# Patient Record
Sex: Female | Born: 1990 | Race: White | Hispanic: No | Marital: Single | Smoking: Never smoker
Health system: Southern US, Community
[De-identification: ages and names within clinical notes are randomized; demographics above are authoritative.]

## PROBLEM LIST (undated history)

## (undated) DIAGNOSIS — E119 Type 2 diabetes mellitus without complications: Secondary | ICD-10-CM

## (undated) DIAGNOSIS — E079 Disorder of thyroid, unspecified: Secondary | ICD-10-CM

## (undated) DIAGNOSIS — R569 Unspecified convulsions: Secondary | ICD-10-CM

## (undated) HISTORY — PX: ABDOMINAL SURGERY: SHX537

---
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
## 2010-07-20 LAB — GLUCOSE, CAPILLARY: Glucose-Capillary: 144 mg/dL — ABNORMAL HIGH (ref 70–99)

## 2010-07-20 LAB — DIFFERENTIAL
Lymphs Abs: 3.2 10*3/uL (ref 0.7–4.0)
Monocytes Relative: 8 % (ref 3–12)
Neutro Abs: 4.1 10*3/uL (ref 1.7–7.7)
Neutrophils Relative %: 51 % (ref 43–77)

## 2010-07-20 LAB — CBC
Hemoglobin: 13.2 g/dL (ref 12.0–15.0)
MCHC: 34.8 g/dL (ref 30.0–36.0)
Platelets: 201 10*3/uL (ref 150–400)
RDW: 13.4 % (ref 11.5–15.5)

## 2010-07-20 LAB — URINALYSIS, ROUTINE W REFLEX MICROSCOPIC
Glucose, UA: 100 mg/dL — AB
Ketones, ur: 15 mg/dL — AB
Protein, ur: NEGATIVE mg/dL
Urobilinogen, UA: 0.2 mg/dL (ref 0.0–1.0)

## 2010-07-20 LAB — COMPREHENSIVE METABOLIC PANEL
Albumin: 3.9 g/dL (ref 3.5–5.2)
BUN: 12 mg/dL (ref 6–23)
Calcium: 9.3 mg/dL (ref 8.4–10.5)
Glucose, Bld: 121 mg/dL — ABNORMAL HIGH (ref 70–99)
Sodium: 135 mEq/L (ref 135–145)
Total Protein: 7.1 g/dL (ref 6.0–8.3)

## 2010-09-15 NOTE — H&P (Signed)
NAME:  Traci Gibson, Traci Gibson                ACCOUNT NO.:  [id number]   MEDICAL RECORD NO.:  [id number]          PATIENT TYPE:  INP   LOCATION:  0107                          FACILITY:  [facility]   PHYSICIAN:  Lalla Brothers, MDDATE OF BIRTH:  27-Oct-1990   DATE OF ADMISSION:  11/23/2007  DATE OF DISCHARGE:                       PSYCHIATRIC ADMISSION ASSESSMENT   IDENTIFICATION:  A 20 year old female who reports being in the 11th  grade at [employer] but also states she is in the 10nth  grade this fall at other times is admitted emergently involuntarily on a  [place] petition for commitment upon transfer from [facility] emergency department for inpatient stabilization and  treatment of suicide risk and depression.  The patient reports a 13-month  history of progressive depression, blaming herself for parental conflict  which catches her in the middle as both parents demand her loyalty,  though they are divorced, living in the same household with an aunt and  her children.  The patient plans to stab herself to death the sharps in  the home while also stating she cannot stand to continue to look herself  in her own room to hide and just wants to die.  She is apparently still  impacted by the death of a great uncle of in 2009/01/16and came to  the emergency department during the funeral on May 29, 2007, having  a pseudoseizure though with a history of a seizure disorder such that  EEG was recommended after CT scan was negative but was not definitely  performed subsequently on an outpatient basis.  The patient is confusing  by her cognitive dissonance and inconsistent memory.   HISTORY OF PRESENT ILLNESS:  The patient reportedly has a history of  ADHD, and at the time of her [facility] emergency department  evaluation May 29, 2007, she also had a history of bipolar disorder  diagnosis.  At the time of that evaluation, the patient was on  Depakote  1500 mg ER every bedtime, and her blood level in the emergency  department was 110.  The patient was on Concerta and Dexedrine at that  time.  At the time of the current admission, she is on Depakote 1500 mg  ER every bedtime, Seroquel 75 mg every bedtime, Dexedrine 15 mg taking 2  in the morning and 1 at mid day, Strattera 40 mg every morning, and  metformin 500 mg taking 2 in the morning and 1 at supper.  The patient  does not know why she is taking the Seroquel.  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  She reports having current therapy with Dr. [facility]  in [facility], apparently living in [place].  The patient has  apparently stayed with grandmother in [place] in the past as well as  parents having lived in [place] in the past as well.  The patient has  a history of significant somatization with hyperventilation diagnosis  for an emergency department visit to [facility] in April 2005.  She was concluded to have a pseudoseizure at the time of her May 29, 2007, emergency department visit, at which time she also had right upper  quadrant abdominal pain such that she had a CT scan of the abdomen and  pelvis as well as the head, all of which were normal.  Her urine drug  screen was negative at that time except amphetamine and continues to be  negative at this time in the emergency department except for amphetamine  from her Dexedrine.  The patient seems to have negative attributions for  taking so many medications, but she seems to have a somatoform disorder  style in which she seeks or receives other test or treatments.  The  patient will acknowledge that she is blaming herself and has become more  depressed over the last 2 months.  With a previous diagnosis of bipolar  disorder, this likely represents bipolar depression.  She does not  acknowledge other specific anxiety.   She maybe psychologically  overwhelmed having a history of epilepsy and diabetes mellitus type 2.  The patient will not contract for safety.  She will not at this time  clarify definite mania or psychosis in the past, though both must be in  the differential diagnosis.  The patient does not acknowledge other  psychic trauma, though such must be suspect particularly with the  chaotic family life.  Differential diagnosis is therefore broad and  cannot be clarified until the patient can provide more useful history  and possibly the family can corroborate.   PAST MEDICAL HISTORY:  The patient reportedly sees Doctor, general practice at [facility] in [place], [place].  She reports since time  of arrival that she has had a single seizure 2 years ago as a reason for  treatment of epilepsy with Depakote, though it is not possible to be  certain that the Depakote was not started for bipolar disorder.  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  She did not have an EEG at that time but such  was recommended on an outpatient basis.  Her Depakote level was 110 at  that time on Depakote 1500 mg ER every bedtime.  She reports having type  2 diabetes mellitus with q.i.d. capillary blood glucose monitoring.  The  patient is obese with striae.  She has seborrheic dermatitis of the  scalp.  She has eyeglasses.  She had hyperventilation in the emergency  department at [facility] in April 2005 and in fact had 8  emergency department visits there between 2002 and 2009.  She had  chicken pox in early childhood.  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  She has  allergy to erythromycin and penicillin manifested by urticaria and  edema.  SHE IS SENSITIVE TO ADDERALL MANIFESTED BY OVERACTIVITY AND   SENSITIVE TO ABILIFY MANIFESTED BY DYSTONIA.  She has had no heart  murmur or arrhythmia.   REVIEW OF SYSTEMS:  The patient denies difficulty with gait, gaze or  continence.  She denies exposure to communicable disease or toxins.  She  denies rash, jaundice or purpura.  There is no chest pain, palpitations  or presyncope currently.  There is no cough, congestion or dyspnea.  She  has  no headache or sensory loss currently.  There is no memory loss or  coordination deficit by history, though the patient is confused with  poor memory at the time of her admission.  The patient has no abdominal  pain, nausea, vomiting or diarrhea currently.  There is no dysuria or  arthralgia.   IMMUNIZATIONS:  Up-to-date.   FAMILY HISTORY:  The patient resides with divorced parents in the same  household where also lives the patient's sister and brother as well as  an aunt and 3 cousins so that there are 9 in the household.  The patient  reports that parents make her choose one of the other them, putting her  in the middle.  Family history is otherwise to be more fully determined.  Mother has acknowledged that the family used to live in [place], and  apparently grandmother still resides there.  Family history is otherwise  to be determined..   SOCIAL AND DEVELOPMENTAL HISTORY:  The patient is reportedly entering  the 11th grade at [employer] more consistent with age  though at one point she suggests that she just finished the 10th grade  and will be going into the 10th.  The patient wants to be a  International aid/development worker, but she is cognitively limited in memory and overall  understanding and organization.  The patient cannot clarify whether this  may be associated with medications such as Seroquel, consequences of  metabolic management of diabetes, consequences of seizures or the  treatment with Depakote, or somatization.  The patient denies use of  alcohol or illicit drugs.  She denies sexual  activity, reports last GYN  exam was in 2008 and last menses was 1 week ago as reported in the  emergency department or 1 month ago as reported here at the [facility].   ASSETS:  The patient wants to be a International aid/development worker and reports she is  fatigued with family drama and trauma.   MENTAL STATUS EXAM:  Height is 159 cm and weight is 75.5 kg.  Blood  pressure is 134/89 with heart rate of 86 sitting and 132/92 with heart  rate of 91 standing.  She is right handed.  She seems slowed and  somewhat confused.  Her memory is poor currently, and she has modest to  minimal interest.  She is inconsistent in answering questions, giving  differing answers at different times.  She has multiple medications as  well as medical diagnoses in addition to her psychiatric diagnoses that  may all be important in the differential diagnosis.  There are no  definite hallucinations and no definite paranoia, though she is guarded  and slowed.  We cannot determine that an EEG was performed after her  last emergency department visit to [facility] when it was  recommended.  The patient has reported as of that time that she has a  history of bipolar disorder and now is severely dysphoric with anhedonia  but also the pattern historically of reactivity of mood and impulse  control difficulty.  The patient reportedly has a history of ADHD and is  significantly inattentive and impulsive, though hyperactivity is not  evident at this moment.  She has no specific anxiety, though avoidance  is evident and post-traumatic diathesis is possible.  She has always  been on multiple medications for ADHD including currently Strattera in  addition to Dexedrine.  She has suicidal ideation with a plan to stab  herself with a sharp objects such  as having knives in the home.  She is  not homicidal or assaultive.   IMPRESSION:  AXIS I:  1. Bipolar disorder, depressed severe.  2. 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  3. Undifferentiated somatoform disorder.  4. Parent child problem.  5. Other specified family circumstances  6. Other interpersonal problem.  AXIS II:  Diagnosis deferred.  AXIS III:  1. Type 2 diabetes mellitus.  2. Obesity.  3. History of at least single seizure as well as pseudoseizure.  4. Obesity.  5. Premature birth living child.  6. Eyeglasses.  7. ALLERGY OR SENSITIVITY TO PENICILLIN, ERYTHROMYCIN, ABILIFY AND      ADDERALL.  AXIS IV:  Stressors:  Family extreme, acute and chronic; phase of life  severe, acute and chronic; school mild, acute and chronic; medical  moderate, acute and chronic.  AXIS V:  GAF on admission 32 with highest in last year estimated at 62.   PLAN:  The patient is admitted for inpatient adolescent psychiatric and  multidisciplinary and multimodal behavioral treatment in a team-based  programmatic locked psychiatric unit.  We will discontinue Seroquel and  Strattera.  Will switch Dexedrine in divided doses to Vyvanse as a  single 70 mg dose every morning initially.  EEG portable is ordered  along with prolactin, cortisol, hemoglobin A1c and lipid profile in the  morning, and a Depakote level was ordered.  Zoloft may be necessary in  combination with above treatment.  Cognitive behavioral therapy, anger  management, interpersonal therapy, social and communication skill  training, problem-solving and coping skill training, learning  strategies, individuation separation and family therapy can be  undertaken.  Estimated length stay is 7 days with target symptom for  discharge being stabilization of suicide risk and mood, stabilization of  cognitive confusion and memory impairment particularly relative to need  to manage medical problems and the treatment as well, and generalization  of the capacity for safe effective participation in family, household  and outpatient treatment as possible.       Lalla Brothers, MD  Electronically Signed     GEJ/MEDQ  D:  11/23/2007  T:  11/23/2007  Job:  [id number]

## 2010-09-18 NOTE — Discharge Summary (Signed)
NAME:  Traci Gibson, Traci Gibson                ACCOUNT NO.:  [id number]   MEDICAL RECORD NO.:  [id number]          PATIENT TYPE:  INP   LOCATION:  0107                          FACILITY:  [facility]   PHYSICIAN:  Lalla Brothers, MDDATE OF BIRTH:  Sep 24, 1990   DATE OF ADMISSION:  11/23/2007  DATE OF DISCHARGE:  11/29/2007                               DISCHARGE SUMMARY   Room 107 Bed B   IDENTIFICATION:  A 20 year old female who would enter the eleventh grade  this fall at [employer] if again residing in [place] was admitted emergently involuntarily on a [facility]  petition for commitment upon transfer from [facility]  Emergency Department for inpatient stabilization and treatment of  suicide risk and depression.  The patient reported a 14-month history of  progressive depression significantly decompensating around family and  household patterns of conflict and displacement of nurturing.  The  patient planned to stab herself to death with sharps in the home stating  she could not continue to lock herself in her own room to hide away.  She just wanted to die, and indicated she may still be impacted by the  death of great uncle with patient having to attend the emergency  department for a pseudoseizure during the funeral May 29, 2007.  For  full details please see the typed admission assessment.   SYNOPSIS OF PRESENT ILLNESS:  At the time of admission the patient is  taking Depakote 1500 mg ER every bedtime, Seroquel 75 mg every bedtime,  Strattera 40 mg every morning, Dexedrine 15 mg SR, has 2 in the morning  and 1 at lunch, and Metformin 1000 mg in the morning and 500 mg at  supper.  The patient maintains that she is intellectually capable of the  study necessary in the future to become a International aid/development worker.  She has  significant somatoform fixations and displacements such as she receives  frequent emergency department visits and various diagnoses  and  treatments.  She had a CT scan of the abdomen, pelvis, and head in  January 2009 at [facility] when brought from her uncle's  funeral with abdominal pain and seizure symptoms.  The emergency  department record documented bipolar disorder and ADHD as her diagnoses,  and she had been seen frequently in the emergency department since 2002.  Mother and patient have resided in the [place] area frequently,  though currently the patient resides with divorced parents, an aunt and  her 3 children, and the patient's sister and brother.  Mother has steady  employment currently, and the patient gets caught in the middle of the  arguments between divorced parents with mother acknowledging that father  is counterproductive to the patient while the patient depends on mother.  The patient has ongoing treatment for type 2 diabetes mellitus, and  reports last GYN exam was in 2008.  She is allergic to ADDERALL  manifested by overactivity and sensitive to ABILIFY manifested by  dystonia.  She may have had a single seizure 2 years ago for which she  is prescribed Depakote, although  the Depakote may have been prescribed  for bipolar diagnosis.  She has eyeglasses.  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   INITIAL MENTAL STATUS EXAMINATION:  The patient is right-handed with  intact neurological exam except cognitive screen would suggest  borderline intellectual capability including among different disciplines  in the hospital program.  The patient had severe dysphoria at the time  of admission with anhedonia and withdrawal.  She had suicidal ideation  with a plan to stab herself.  She does not have hallucinations or  paranoia.  She is guarded and slow.  She has a history of ADHD described  as combined type.  She is more regressive and playful in her acting out  including verbally rather than being definitely character disordered.   LABORATORY FINDINGS:  The patient does perform q.i.d. capillary blood   glucose monitoring at home, and this was carried out on the hospital  unit during her stay.  Initially, her fasting CBG was 86 followed by 71.  Her maximum CBG was 105 prior to meal later in the day.  She had 1 CBG  low at 66, but did not require special treatment being performed before  a meal and she ate immediately at that time.  Afterward she had no  symptoms of hypoglycemia.  In the emergency department, basic metabolic  panel was normal except random glucose was 130 and calcium was  borderline elevated at 10.8 with reference range 8.9 to 10.3 with sodium  136, potassium 4.7, CO2 23 with reference range 22 to 30 and BUN 15 with  creatinine 0.8.  Urinalysis revealed specific gravity of 1.025 with pH  5, 1+ ketones, trace of protein, trace of leukocyte esterase, 0 to 2  wbc's, moderate bacteria and mucus with 5-10 epithelial.  CBC was normal  except red count slightly low at 4.24 million with lower limit of normal  4.3 with white count 6300, hemoglobin 12.7, MCV 92, MCH 30, and platelet  count 333,000.  Blood alcohol, acetaminophen, and salicylate were  negative.  Urine drug screen was negative except for the presence of  amphetamine from her Dexedrine.  Urine pregnancy test was negative.  At  the [facility] initial 10-hour Depakote level was 78.2  mcg/mL.  TSH was slightly elevated at 10.809 with reference range 0.35  to 4.5 likely due to Depakote or mental disorder.  Free T4 was normal at  1.25 with reference range 0.89 to 1.8.  Free T3 was normal at 3.7 with  reference range 2.3 to 4.2.  Thyroid antibodies were negative.  A repeat  Depakote level the following hospital day was 115.4 mcg/mL, and last  level at [facility] in January 2009 had been 110.  Hemoglobin  A1c was slightly elevated at 6.6 with reference range 4.6 to 6.1.  Serum  cortisol in the morning was normal at 13.3 with reference range 4.3 to  22.4.  Prolactin was normal at 16.9 with reference range  2.8 to 29.2.  Ten hour fasting lipid profile revealed total cholesterol 172 with HDL  60 and LDL 80 and VLDL was 32 with a reference range for VLDL being 0 to  40.  Triglycerides were 162 with 14-hour fasting reference value of less  than 150 mg/dL.  Hepatic function panel was normal with albumin 3.9 with  reference range 3.5 to 5.2, total bilirubin 0.4, AST 18, ALT 18, and GGT  25.  Urine probe for gonorrhea and chlamydia by DNA amplification were  both negative, and  RPR was nonreactive.  EEG was attempted on the day  after admission, but the patient vomited during the attempts of the  technician to record the tracing informing the staff that she had eaten  sausage for breakfast that made her sick.  The patient did not manifest  status epilepticus on the limited recording.  The patient had an EEG  recorded portable at the bedside November 27, 2007 with neurology  interpretation pending.   HOSPITAL COURSE AND TREATMENT:  General medical exam by Jorje Guild, PA-C  noted allergy to PENICILLIN and ERYTHROMYCIN as well as a sensitivity to  ADDERALL and ABILIFY.  The patient reports a 6-pound weight reduction in  the last week from walking, but remains overweight with BMI of 29.9.  She has eyeglasses.  She had menarche at age 31 with a regular menses  reporting last menses to be this month.  General medical exam was,  otherwise, unremarkable and she denies sexual activity.  Thyroid exam  was normal clinically.  The patient had no seizure activity during the  hospital stay.  She did have frequent somatic complaints, and her low  CBG in the mid 60s was on 1 occasion only.  Final fasting glucose was 81  by CBG.  She was afebrile throughout the hospital stay.  Her final blood  pressure was 110/74 with heart rate of 81 and standing blood pressure  118/75 with heart rate of 95.  Her height was 159 cm and weight was 75.5  kg.  Every effort was made to simplify the patient's medication regimen  as she  attempts to assume more independence and as mother will be  working and staying away from the patient for at least a couple of weeks  while the patient will move back to [place].  The patient was  hydrated orally, and nutritional interventions were applied as  previously learned on her weight and carbohydrate control diet.  Metformin and Depakote were continued.  Dexedrine was changed to  Vyvanse; titrated to 100 mg every morning.  Strattera and Seroquel were  discontinued.  The patient gradually but steadily improved in her  capacity for family and behavioral therapy work as the hospitalization  advanced.  The patient made comments to peers that she was involved in  witchcraft to the point that peers separated themselves from her for 1  group therapy.  The patient would laugh about making such comments and  the peers' response at times.  She was attention seeking and somatically  fixated.  Medication adjustments were explained to patient and mother.  Every effort was made to not reinforce the patient's regression and  somatization.  No other medical disorders were documented during the  hospital stay.  She required no seclusion or restraint during the  hospital stay.  Mother was present for the final family therapy session,  and was making plans for the patient's disposition to reside in  [place] while mother must work 2 more weeks in [place],  [place].  Mother will then move with the patient from [place]  to [place], [place].  The patient will stay with grandparents  until mother is ready for this move.  Father could not be supportive or  containing for the patient.  The patient required no seclusion or  restraint during the hospital stay.   FINAL DIAGNOSES:  AXIS I:  1.  Bipolar disorder, depressed, moderate.  2.  Attention deficit hyperactivity disorder combined, subtype moderate  severity.  3.  Undifferentiated  somatoform disorder.  4.  Parent child   problem.  5.  Other specified family circumstances.  6.  Other  interpersonal problem.  AXIS II:  Borderline intellectual functioning (provisional diagnosis).  AXIS III:  1.  Type 2 diabetes mellitus.  2.  Obesity with mild  hypertriglyceridemia.  3.  History of single seizure with subsequent  pseudoseizures and hyperventilation.  4.  Premature birth living child.  5.  Eyeglasses.  6.  Allergy or sensitivity to PENICILLIN, ERYTHROMYCIN,  ABILIFY, and ADDERALL.  7.  Irregular menses.  8.  Emergency department  borderline elevation of serum calcium likely from under hydration.  9.  Elevated TSH likely due to Depakote or mental disorder.  AXIS IV:  Stressors:  Family extreme acute and chronic; phase of life  severe acute and chronic; school moderate acute and chronic; medical  moderate acute and chronic.  AXIS V:  GAF on admission was 32 with highest in the last year estimated  at 62 and discharge GAF was 50.   PLAN:  Final report on the patient's EEG interpreted by Dr. Levert Feinstein  was normal study with no epileptiform discharge in waking but not sleep  recording.  There was a T5 sharp transient during hyperventilation with  following slow waves of no clinical significance.  The patient follows a  weight and carbohydrate control diet, and has no restrictions on  physical activity.  She has no wound care or pain management needs.  Crisis and safety plans are outlined if needed.  She and mother provided  a copy of her laboratory testing results from [facility]  to take to her next appointment medically which mother states is  available immediately in [facility], but has to be set up in  [place] or [place].  Psychiatric aftercare will therefore be  scheduled in [place] close to [place], though mother anticipates  they may be moving again.  She is discharged on the following  medication:   1. Vyvanse 50 mg capsule to take 2 every morning, quantity #60 with no       refill prescribed.  2. Depakote 500 mg ER to take 3 tablets every bedtime, quantity #90      with no refill prescribed.  3. Metformin 500 mg tablet taking 2 every morning and 1 every supper,      having her own home supply.  It may be necessary to reduce      metformin after the Seroquel discontinuation if hyperglycemia      subsides somewhat, though she is off Strattera as well.  They are      educated on the medication including FDA guidelines and warnings.      She has intake at [facility] December 01, 2007 at 0800      at [phone].      Lalla Brothers, MD  Electronically Signed     GEJ/MEDQ  D:  12/01/2007  T:  12/01/2007  Job:  [id number]   cc:   [facility]  [address],  [place], [place]  [address]  Fax [phone]

## 2011-01-21 LAB — RAPID URINE DRUG SCREEN, HOSP PERFORMED
Amphetamines: POSITIVE — AB
Barbiturates: NOT DETECTED
Tetrahydrocannabinol: NOT DETECTED

## 2011-01-21 LAB — COMPREHENSIVE METABOLIC PANEL
Albumin: 4
Alkaline Phosphatase: 56
BUN: 10
CO2: 27
Chloride: 101
Glucose, Bld: 83
Potassium: 4.1
Total Bilirubin: 0.4

## 2011-01-21 LAB — DIFFERENTIAL
Basophils Absolute: 0
Basophils Relative: 0
Monocytes Absolute: 0.8
Neutro Abs: 3.9
Neutrophils Relative %: 53

## 2011-01-21 LAB — VALPROIC ACID LEVEL: Valproic Acid Lvl: 110 — ABNORMAL HIGH

## 2011-01-21 LAB — CBC
HCT: 38
Hemoglobin: 13
RBC: 4.34
WBC: 7.3

## 2011-01-21 LAB — URINALYSIS, ROUTINE W REFLEX MICROSCOPIC
Hgb urine dipstick: NEGATIVE
Protein, ur: NEGATIVE
Urobilinogen, UA: 0.2

## 2011-01-21 LAB — ETHANOL: Alcohol, Ethyl (B): <5

## 2011-01-29 LAB — HEPATIC FUNCTION PANEL
ALT: 18
AST: 18
Total Protein: 7.3

## 2011-01-29 LAB — GC/CHLAMYDIA PROBE AMP, URINE: GC Probe Amp, Urine: NEGATIVE

## 2011-01-29 LAB — HEMOGLOBIN A1C
Hgb A1c MFr Bld: 6.6 — ABNORMAL HIGH
Mean Plasma Glucose: 158

## 2011-01-29 LAB — GLUCOSE, CAPILLARY
Glucose-Capillary: 105 — ABNORMAL HIGH
Glucose-Capillary: 64 — ABNORMAL LOW
Glucose-Capillary: 70
Glucose-Capillary: 75
Glucose-Capillary: 76
Glucose-Capillary: 78
Glucose-Capillary: 78
Glucose-Capillary: 81
Glucose-Capillary: 85
Glucose-Capillary: 90
Glucose-Capillary: 91

## 2011-01-29 LAB — LIPID PANEL
Cholesterol: 172 — ABNORMAL HIGH
LDL Cholesterol: 80
Triglycerides: 162 — ABNORMAL HIGH

## 2011-01-29 LAB — T4, FREE: Free T4: 1.25

## 2011-01-29 LAB — THYROID ANTIBODIES
Thyroglobulin Ab: <30 U/mL
Thyroperoxidase Ab SerPl-aCnc: <25 U/mL

## 2011-11-24 ENCOUNTER — Emergency Department: Payer: Self-pay | Admitting: Emergency Medicine

## 2013-02-27 ENCOUNTER — Ambulatory Visit: Payer: Self-pay | Admitting: Obstetrics and Gynecology

## 2013-02-27 LAB — CBC
HCT: 39.6 % (ref 35.0–47.0)
HGB: 13.2 g/dL (ref 12.0–16.0)
MCHC: 33.4 g/dL (ref 32.0–36.0)
RDW: 14.8 % — ABNORMAL HIGH (ref 11.5–14.5)
WBC: 6.6 10*3/uL (ref 3.6–11.0)

## 2013-03-05 ENCOUNTER — Ambulatory Visit: Payer: Self-pay | Admitting: Obstetrics and Gynecology

## 2014-08-23 NOTE — Op Note (Signed)
PATIENT NAME:  Traci Gibson, Traci Gibson MR#:  [id number] DATE OF BIRTH:  October 09, 1990  DATE OF PROCEDURE:  03/05/2013  PREOPERATIVE DIAGNOSES:  1.  Menorrhagia.  2.  Severe dysmenorrhea.  3.  Family history of endometrial cancer.   POSTOPERATIVE DIAGNOSES:  1.  Menorrhagia.  2.  Severe dysmenorrhea.  3.  Family history of endometrial cancer.   OPERATIVE PROCEDURES:  1.  Cervical biopsy.  2.  Laparoscopy with peritoneal biopsies and adhesiolysis.  3.  Hysteroscopy with dilation and curettage.  4.  Mirena IUD insertion.   SURGEON: Prentice Docker. Mahiya Kercheval, M.D.   FIRST ASSISTANT: Dr. Valentino Saxon.  SECOND ASSISTANT: Ardeen Jourdain, PA-S.   ANESTHESIA: General endotracheal.   INDICATIONS: The patient is a 24 year old white female, para 0, who presents for surgical evaluation of severe dysmenorrhea and menorrhagia. The patient could not have outpatient evaluation because of anxiety and mental learning differences.   FINDINGS AT SURGERY:  1.  Reveals a powder burn implant 5 mm at 12:00 on the cervix, possibly consistent with endometriosis.  2.  Laparoscopy demonstrated adhesions in the left aspect of the cul-de-sac. No other significant stigmata of endometriosis was identified. There were small Allen-Masters hernias noted associated with the round ligaments. The ovaries were grossly normal, tubes grossly normal, uterus grossly normal and upper abdomen was normal. 3.  Hysteroscopy was notable for shaggy endometrium.   DESCRIPTION OF PROCEDURE: The patient was brought to the operating room where she was placed in the supine position. General endotracheal anesthesia was induced without difficulty. She was placed in the dorsal lithotomy position using the bumblebee stirrups. A ChloraPrep and Betadine abdominal, perineal, intravaginal prep and drape was performed in standard fashion. A red Robinson catheter was used to drain 25 mL of urine from the bladder. A Hulka tenaculum was placed onto the cervix. Subumbilical  vertical incision 5 mm in length was made. The Optiview laparoscopic trocar system was used to place a 5 mm port directly into the abdominal pelvic cavity. No evidence of bowel or vascular injury was noted. A second 5 mm port was placed in the suprapubic region 2 fingerbreadths above the symphysis pubis. The above-noted findings were photo documented. Representative biopsies of the left cul-de-sac and central cul-de-sac regions were made. There were some adhesions in the left pelvic sidewall and bowel, which were taken down through sharp and blunt dissection. Following completion of the laparoscopic portion of the surgery, all instrumentation was removed from the abdomen. The pneumoperitoneum was released. The incisions were closed with 4-0 Vicryl suture in a simple interrupted manner and covered with Dermabond.   Hysteroscopy was then performed in routine fashion. The Hulka tenaculum was removed. A single-tooth tenaculum was placed on the anterior lip of the cervix. The uterus was sounded to 8.5 cm. Hanks dilators were used to dilate the endocervical canal to a #20-French caliber. The [facility] hysteroscope using lactated Ringer's as irrigant was used to identify the intrauterine environment. Following hysteroscopy, curettage was performed of the endometrial cavity with both smooth and serrated curettes. Stone polyp forceps were likewise used to extract tissue that was left inside. Upon completion of the D and C portion of the procedure,  the Mirena IUD was inserted in through standard technique. The string was trimmed at 3 cm. Once all procedures were completed, all instrumentation was removed from the vagina. The patient was then awakened, mobilized, and taken to the recovery room in satisfactory condition.   ESTIMATED BLOOD LOSS: 25 mL.  INTRAVENOUS FLUIDS: Were not calculated at the  time of this dictation.   URINE OUTPUT: 25 mL.  COUNTS: All instruments, needle and sponge counts were verified as correct.   ____________________________ Prentice DockerMartin A. Chameka Mcmullen, MD mad:aw D: 03/05/2013 14:11:20 ET T: 03/05/2013 14:30:35 ET JOB#: [id number]  cc: Daphine DeutscherMartin A. Malai Lady, MD, <Dictator> [facility] Prentice DockerMARTIN A Careem Yasui MD ELECTRONICALLY SIGNED 03/10/2013 20:00

## 2014-12-13 ENCOUNTER — Emergency Department (HOSPITAL_COMMUNITY)
Admission: EM | Admit: 2014-12-13 | Discharge: 2014-12-13 | Disposition: A | Discharge status: Home / Self Care | Payer: Medicaid Other | Attending: Emergency Medicine | Admitting: Emergency Medicine

## 2014-12-13 ENCOUNTER — Encounter (HOSPITAL_COMMUNITY): Payer: Self-pay | Admitting: Emergency Medicine

## 2014-12-13 DIAGNOSIS — R21 Rash and other nonspecific skin eruption: Secondary | ICD-10-CM | POA: Diagnosis present

## 2014-12-13 DIAGNOSIS — E119 Type 2 diabetes mellitus without complications: Secondary | ICD-10-CM | POA: Insufficient documentation

## 2014-12-13 DIAGNOSIS — Z880 Allergy status to penicillin: Secondary | ICD-10-CM | POA: Diagnosis not present

## 2014-12-13 DIAGNOSIS — L309 Dermatitis, unspecified: Secondary | ICD-10-CM | POA: Diagnosis not present

## 2014-12-13 HISTORY — DX: Unspecified convulsions: R56.9

## 2014-12-13 HISTORY — DX: Type 2 diabetes mellitus without complications: E11.9

## 2014-12-13 MED ORDER — HYDROCORTISONE 1 % EX CREA: TOPICAL_CREAM | CUTANEOUS | Status: DC

## 2014-12-13 MED ORDER — DEXAMETHASONE SODIUM PHOSPHATE 4 MG/ML IJ SOLN
8.0000 mg | Freq: Once | INTRAMUSCULAR | Status: AC
  Administered 2014-12-13: 8 mg via INTRAMUSCULAR
  Filled 2014-12-13: qty 2

## 2014-12-13 MED ORDER — DIPHENHYDRAMINE HCL 25 MG PO CAPS: 25.0000 mg | ORAL_CAPSULE | Freq: Four times a day (QID) | ORAL | Status: DC | PRN

## 2014-12-13 NOTE — ED Provider Notes (Signed)
CSN: [id number]     Arrival date & time 12/13/14  2253 History  This chart was scribed for Derwood Kaplan, MD by Phillis Haggis, ED Scribe. This patient was seen in room APA07/APA07 and patient care was started at 11:10 PM.   Chief Complaint  Patient presents with  . Rash   The history is provided by the patient. No language interpreter was used.  HPI Comments: Traci Gibson is a 24 y.o. Female with hx of DM who presents to the Emergency Department complaining of a rash onset one day ago. Pt states that she is itching all over and has red spots all over her body that she says looks like bite marks that started last night. States that the rash is worse at night but has been having more spots appear on her skin throughout the day. She states that she has taken benadryl to no relief. Pt states that she is currently saying at her grandmother's house and deny new detergents or soaps. She states that the grandmother has 3 dogs but they do not have fleas. She states that she started a new medication for her DM, Simvastatin, on 11/22/14. She denies being outside in the woods, new activities or foods, or contacts with similar symptoms. Denies rhinorrhea, mouth sores, sore throat or cough.   Past Medical History  Diagnosis Date  . Diabetes mellitus without complication   . Seizures    Past Surgical History  Procedure Laterality Date  . Abdominal surgery     History reviewed. No pertinent family history. Social History  Substance Use Topics  . Smoking status: Never Smoker   . Smokeless tobacco: None  . Alcohol Use: No   OB History    No data available     Review of Systems  Constitutional: Negative for fever and activity change.  HENT: Negative for mouth sores, rhinorrhea and sore throat.   Respiratory: Negative for cough.   Skin: Positive for rash.  Allergic/Immunologic: Negative for environmental allergies and food allergies.   Allergies  Abilify and Penicillins  Home Medications    Prior to Admission medications   Medication Sig Start Date End Date Taking? Authorizing Provider  diphenhydrAMINE (BENADRYL) 25 mg capsule Take 1 capsule (25 mg total) by mouth every 6 (six) hours as needed for itching. 12/13/14   Derwood Kaplan, MD  hydrocortisone cream 1 % Apply to affected area 2 times daily 12/13/14   Niylah Hassan, MD   BP 131/82 mmHg  Pulse 92  Temp(Src) 98.1 F (36.7 C) (Oral)  Resp 20  Ht  (1.575 m)  Wt 125 lb (56.7 kg)  BMI 22.86 kg/m2  SpO2 100%  Physical Exam  Constitutional: She appears well-developed and well-nourished.  HENT:  Head: Normocephalic and atraumatic.  Mouth/Throat: Oropharynx is clear and moist.  No oral lesions  Eyes: Conjunctivae are normal. Right eye exhibits no discharge. Left eye exhibits no discharge. No scleral icterus.  Neck: Normal range of motion. No JVD present.  Cardiovascular: Normal rate and regular rhythm.   Pulmonary/Chest: Effort normal.  Neurological: She is alert. Coordination normal.  Skin: Skin is warm and dry. No rash noted. She is not diaphoretic. No erythema.  Diffuse macules with crusted tops on some of them over the lower extremities, upper extremities, and partially over the neck and torso. No pustules, vesicles, blanching or signs of infection.   Psychiatric: She has a normal mood and affect.  Nursing note and vitals reviewed.       ED  Course  Procedures (including critical care time) DIAGNOSTIC STUDIES: Oxygen Saturation is 100% on RA, normal by my interpretation.    COORDINATION OF CARE: 11:15 PM-Discussed treatment plan which includes hydrocortisone cream and benadryl with pt at bedside and pt agreed to plan.   Labs Review Labs Reviewed - No data to display  Imaging Review No results found.    EKG Interpretation None      MDM   Final diagnoses:  Dermatitis   I personally performed the services described in this documentation, which was scribed in my presence. The recorded  information has been reviewed and is accurate.  Pt with skin rash, diffuse, macules that are itchy - crusting due to her scratching. No infection currently. Pt was started on simva on 7/22 - most common derm allergic rxn is urticaria and eczema - she has neither. No bugs seen, no outdoor activities, no hx of similar rash.   Will tx with steroid ointment and benadryl. Advised return to there ER if getting worse, otherwise to see pcp.     Derwood Kaplan, MD 12/13/14 [phone]

## 2014-12-13 NOTE — Discharge Instructions (Signed)
We suspect that you have allergic type reaction. It is possible that this could be medication induced - but appears less likely (< 1 %  Chance).  We recommend that you check your sleeping site, clean the sheets, and clear the area. See if there are any bugs on site, if so exterminate them. Take the meds prescribed Return to the ER if the rash is getting severe. See your doctor in 3-5 days - since you are on new meds.   Allergies Allergies may happen from anything your body is sensitive to. This may be food, medicines, pollens, chemicals, and nearly anything around you in everyday life that produces allergens. An allergen is anything that causes an allergy producing substance. Heredity is often a factor in causing these problems. This means you may have some of the same allergies as your parents. Food allergies happen in all age groups. Food allergies are some of the most severe and life threatening. Some common food allergies are cow's milk, seafood, eggs, nuts, wheat, and soybeans. SYMPTOMS   Swelling around the mouth.  An itchy red rash or hives.  Vomiting or diarrhea.  Difficulty breathing. SEVERE ALLERGIC REACTIONS ARE LIFE-THREATENING. This reaction is called anaphylaxis. It can cause the mouth and throat to swell and cause difficulty with breathing and swallowing. In severe reactions only a trace amount of food (for example, peanut oil in a salad) may cause death within seconds. Seasonal allergies occur in all age groups. These are seasonal because they usually occur during the same season every year. They may be a reaction to molds, grass pollens, or tree pollens. Other causes of problems are house dust mite allergens, pet dander, and mold spores. The symptoms often consist of nasal congestion, a runny itchy nose associated with sneezing, and tearing itchy eyes. There is often an associated itching of the mouth and ears. The problems happen when you come in contact with pollens and  other allergens. Allergens are the particles in the air that the body reacts to with an allergic reaction. This causes you to release allergic antibodies. Through a chain of events, these eventually cause you to release histamine into the blood stream. Although it is meant to be protective to the body, it is this release that causes your discomfort. This is why you were given anti-histamines to feel better. If you are unable to pinpoint the offending allergen, it may be determined by skin or blood testing. Allergies cannot be cured but can be controlled with medicine. Hay fever is a collection of all or some of the seasonal allergy problems. It may often be treated with simple over-the-counter medicine such as diphenhydramine. Take medicine as directed. Do not drink alcohol or drive while taking this medicine. Check with your caregiver or package insert for child dosages. If these medicines are not effective, there are many new medicines your caregiver can prescribe. Stronger medicine such as nasal spray, eye drops, and corticosteroids may be used if the first things you try do not work well. Other treatments such as immunotherapy or desensitizing injections can be used if all else fails. Follow up with your caregiver if problems continue. These seasonal allergies are usually not life threatening. They are generally more of a nuisance that can often be handled using medicine. HOME CARE INSTRUCTIONS   If unsure what causes a reaction, keep a diary of foods eaten and symptoms that follow. Avoid foods that cause reactions.  If hives or rash are present:  Take medicine as directed.  You may use an over-the-counter antihistamine (diphenhydramine) for hives and itching as needed.  Apply cold compresses (cloths) to the skin or take baths in cool water. Avoid hot baths or showers. Heat will make a rash and itching worse.  If you are severely allergic:  Following a treatment for a severe reaction,  hospitalization is often required for closer follow-up.  Wear a medic-alert bracelet or necklace stating the allergy.  You and your family must learn how to give adrenaline or use an anaphylaxis kit.  If you have had a severe reaction, always carry your anaphylaxis kit or EpiPen with you. Use this medicine as directed by your caregiver if a severe reaction is occurring. Failure to do so could have a fatal outcome. SEEK MEDICAL CARE IF:  You suspect a food allergy. Symptoms generally happen within 30 minutes of eating a food.  Your symptoms have not gone away within 2 days or are getting worse.  You develop new symptoms.  You want to retest yourself or your child with a food or drink you think causes an allergic reaction. Never do this if an anaphylactic reaction to that food or drink has happened before. Only do this under the care of a caregiver. SEEK IMMEDIATE MEDICAL CARE IF:   You have difficulty breathing, are wheezing, or have a tight feeling in your chest or throat.  You have a swollen mouth, or you have hives, swelling, or itching all over your body.  You have had a severe reaction that has responded to your anaphylaxis kit or an EpiPen. These reactions may return when the medicine has worn off. These reactions should be considered life threatening. MAKE SURE YOU:   Understand these instructions.  Will watch your condition.  Will get help right away if you are not doing well or get worse. Document Released: 07/13/2002 Document Revised: 08/14/2012 Document Reviewed: 12/18/2007 [facility] Patient Information 2015 [place], [place]. This information is not intended to replace advice given to you by your health care provider. Make sure you discuss any questions you have with your health care provider.   Rash A rash is a change in the color or texture of your skin. There are many different types of rashes. You may have other problems that accompany your rash. CAUSES    Infections.  Allergic reactions. This can include allergies to pets or foods.  Certain medicines.  Exposure to certain chemicals, soaps, or cosmetics.  Heat.  Exposure to poisonous plants.  Tumors, both cancerous and noncancerous. SYMPTOMS   Redness.  Scaly skin.  Itchy skin.  Dry or cracked skin.  Bumps.  Blisters.  Pain. DIAGNOSIS  Your caregiver may do a physical exam to determine what type of rash you have. A skin sample (biopsy) may be taken and examined under a microscope. TREATMENT  Treatment depends on the type of rash you have. Your caregiver may prescribe certain medicines. For serious conditions, you may need to see a skin doctor (dermatologist). HOME CARE INSTRUCTIONS   Avoid the substance that caused your rash.  Do not scratch your rash. This can cause infection.  You may take cool baths to help stop itching.  Only take over-the-counter or prescription medicines as directed by your caregiver.  Keep all follow-up appointments as directed by your caregiver. SEEK IMMEDIATE MEDICAL CARE IF:  You have increasing pain, swelling, or redness.  You have a fever.  You have new or severe symptoms.  You have body aches, diarrhea, or vomiting.  Your rash is not  better after 3 days. MAKE SURE YOU:  Understand these instructions.  Will watch your condition.  Will get help right away if you are not doing well or get worse. Document Released: 04/09/2002 Document Revised: 07/12/2011 Document Reviewed: 02/01/2011 [facility] Patient Information 2015 [place], [place]. This information is not intended to replace advice given to you by your health care provider. Make sure you discuss any questions you have with your health care provider.

## 2014-12-13 NOTE — ED Notes (Signed)
Patient complaining of bug bites with itching starting last night.

## 2014-12-18 ENCOUNTER — Encounter (HOSPITAL_COMMUNITY): Payer: Self-pay | Admitting: Emergency Medicine

## 2014-12-18 ENCOUNTER — Emergency Department (HOSPITAL_COMMUNITY)
Admission: EM | Admit: 2014-12-18 | Discharge: 2014-12-18 | Disposition: A | Discharge status: Home / Self Care | Payer: Medicaid Other | Attending: Emergency Medicine | Admitting: Emergency Medicine

## 2014-12-18 DIAGNOSIS — Z880 Allergy status to penicillin: Secondary | ICD-10-CM | POA: Diagnosis not present

## 2014-12-18 DIAGNOSIS — B86 Scabies: Secondary | ICD-10-CM | POA: Insufficient documentation

## 2014-12-18 DIAGNOSIS — E119 Type 2 diabetes mellitus without complications: Secondary | ICD-10-CM | POA: Insufficient documentation

## 2014-12-18 DIAGNOSIS — Z79899 Other long term (current) drug therapy: Secondary | ICD-10-CM | POA: Diagnosis not present

## 2014-12-18 DIAGNOSIS — R21 Rash and other nonspecific skin eruption: Secondary | ICD-10-CM | POA: Diagnosis present

## 2014-12-18 LAB — HCG, QUANTITATIVE, PREGNANCY

## 2014-12-18 MED ORDER — PERMETHRIN 5 % EX CREA: TOPICAL_CREAM | CUTANEOUS | Status: DC

## 2014-12-18 MED ORDER — IVERMECTIN 3 MG PO TABS: 3.0000 mg | ORAL_TABLET | Freq: Once | ORAL | Status: DC

## 2014-12-18 NOTE — ED Provider Notes (Signed)
CSN: [id number]     Arrival date & time 12/18/14  0037 History   First MD Initiated Contact with Patient 12/18/14 0102    Chief Complaint  Patient presents with  . Rash     (Consider location/radiation/quality/duration/timing/severity/associated sxs/prior Treatment) HPI  Patient states she's had a rash for about 3 weeks. She states it started on her hands and has spread. She's been seen a couple of times for this rash and has been on multiple medications including Benadryl, prednisone, topical steroid-induced readings without relief of the itching or the rash. She denies any change in her soaps or detergents. She was started on some glass and on July 22 and cetirizine on July 18. She states she was at her grandmother, her husband, and the patient's fianc. She initially stated nobody else in the house had a rash however her wound site is also in the ED with a rash and she admits he does have a rash. She does not know when his rash started. She states they do sleep together and they sleep on air mattress that is covered just by sheets with no pillow top.  PCP in [place]  Past Medical History  Diagnosis Date  . Diabetes mellitus without complication   . Seizures    Past Surgical History  Procedure Laterality Date  . Abdominal surgery     No family history on file. Social History  Substance Use Topics  . Smoking status: Never Smoker   . Smokeless tobacco: None  . Alcohol Use: No  lives with GM   OB History    No data available     Review of Systems  All other systems reviewed and are negative.     Allergies  Abilify and Penicillins  Home Medications   Prior to Admission medications   Medication Sig Start Date End Date Taking? Authorizing Provider  diphenhydrAMINE (BENADRYL) 25 mg capsule Take 1 capsule (25 mg total) by mouth every 6 (six) hours as needed for itching. 12/13/14   Derwood Kaplan, MD  hydrocortisone cream 1 % Apply to affected area 2 times daily 12/13/14    Derwood Kaplan, MD  ivermectin (STROMECTOL) 3 MG TABS tablet Take 1 tablet (3 mg total) by mouth once. 12/18/14   Devoria Albe, MD  permethrin (ELIMITE) 5 % cream Apply to affected area once 12/18/14   Devoria Albe, MD   BP 101/64 mmHg  Pulse 104  Temp(Src) 98 F (36.7 C) (Oral)  Resp 20  Ht 5\' 2"  (1.575 m)  Wt 125 lb (56.7 kg)  BMI 22.86 kg/m2  SpO2 97%  Vital signs normal except for tachycardia  Physical Exam  Constitutional: She is oriented to person, place, and time. She appears well-developed and well-nourished.  Non-toxic appearance. She does not appear ill. No distress.  HENT:  Head: Normocephalic and atraumatic.  Right Ear: External ear normal.  Left Ear: External ear normal.  Nose: Nose normal. No mucosal edema or rhinorrhea.  Mouth/Throat: Mucous membranes are normal. No dental abscesses or uvula swelling.  Eyes: Conjunctivae and EOM are normal.  Neck: Normal range of motion and full passive range of motion without pain.  Pulmonary/Chest: No respiratory distress. She has no rhonchi. She exhibits no crepitus.  Abdominal: Normal appearance.  Musculoskeletal: Normal range of motion. She exhibits no edema or tenderness.  Moves all extremities well.   Neurological: She is alert and oriented to person, place, and time. She has normal strength. No cranial nerve deficit.  Skin: Skin is warm, dry and  intact. Rash noted. No erythema. No pallor.  Patient is noted to have a few lesions on her lower legs as seen in the pictures from her prior ED visit. However she has the most intense rash on her hands bilaterally. She has small raised papules and excoriations involving her hands mainly around the web spaces and the MCPs of her fingers. She also has involvement of the palms of her hands. There is some scattered rash around her neck and on her trunk. None of the lesions appear infected.  Psychiatric: She has a normal mood and affect. Her speech is normal and behavior is normal. Her mood appears  not anxious.  Nursing note and vitals reviewed.       ED Course  Procedures (including critical care time)   I'm going to treat the patient for scabies. I'm also testing her for syphilis because involvement on the palms of her hands however it may be unlikely to be syphilis. She's already been treated for allergic type rashes without improvement.   Labs Review Results for orders placed or performed during the hospital encounter of 12/18/14  hCG, quantitative, pregnancy  Result Value Ref Range   hCG, Beta Chain, Quant, S <1 <5 mIU/mL       Imaging Review No results found. I have personally reviewed and evaluated these images and lab results as part of my medical decision-making.   EKG Interpretation None      MDM   Final diagnoses:  Scabies    New Prescriptions   IVERMECTIN (STROMECTOL) 3 MG TABS TABLET    Take 1 tablet (3 mg total) by mouth once.   PERMETHRIN (ELIMITE) 5 % CREAM    Apply to affected area once    Plan discharge  Devoria Albe, MD, Concha Pyo, MD 12/18/14 [id number]

## 2014-12-18 NOTE — Discharge Instructions (Signed)
You can continue taking the benadryl 50 mg every 4 hrs as needed for itching. Use the lotion and cover your entire body and leave on for 12 hours then wash it off with soap and water. Repeat in 7 days. Take the pill which will also help kill the mites.    Scabies Scabies are small bugs (mites) that burrow under the skin and cause red bumps and severe itching. These bugs can only be seen with a microscope. Scabies are highly contagious. They can spread easily from person to person by direct contact. They are also spread through sharing clothing or linens that have the scabies mites living in them. It is not unusual for an entire family to become infected through shared towels, clothing, or bedding.  HOME CARE INSTRUCTIONS   Your caregiver may prescribe a cream or lotion to kill the mites. If cream is prescribed, massage the cream into the entire body from the neck to the bottom of both feet. Also massage the cream into the scalp and face if your child is less than 62 year old. Avoid the eyes and mouth. Do not wash your hands after application.  Leave the cream on for 8 to 12 hours. Your child should bathe or shower after the 8 to 12 hour application period. Sometimes it is helpful to apply the cream to your child right before bedtime.  One treatment is usually effective and will eliminate approximately 95% of infestations. For severe cases, your caregiver may decide to repeat the treatment in 1 week. Everyone in your household should be treated with one application of the cream.  New rashes or burrows should not appear within 24 to 48 hours after successful treatment. However, the itching and rash may last for 2 to 4 weeks after successful treatment. Your caregiver may prescribe a medicine to help with the itching or to help the rash go away more quickly.  Scabies can live on clothing or linens for up to 3 days. All of your child's recently used clothing, towels, stuffed toys, and bed linens should be  washed in hot water and then dried in a dryer for at least 20 minutes on high heat. Items that cannot be washed should be enclosed in a plastic bag for at least 3 days.  To help relieve itching, bathe your child in a cool bath or apply cool washcloths to the affected areas.  Your child may return to school after treatment with the prescribed cream. SEEK MEDICAL CARE IF:   The itching persists longer than 4 weeks after treatment.  The rash spreads or becomes infected. Signs of infection include red blisters or yellow-tan crust. Document Released: 04/19/2005 Document Revised: 07/12/2011 Document Reviewed: 08/28/2008 [facility] Patient Information 2015 [place], [place]. This information is not intended to replace advice given to you by your health care provider. Make sure you discuss any questions you have with your health care provider.

## 2014-12-18 NOTE — ED Notes (Signed)
Patient given discharge instruction, verbalized understand. Patient ambulatory out of the department.  

## 2014-12-18 NOTE — ED Notes (Signed)
Pt having rash x 3 weeks, has gotten treatment without relief, boyfriend is here for treatment of rash as well

## 2014-12-18 NOTE — ED Notes (Signed)
Lab already drew blood, changing pregnancy for lab to complete

## 2014-12-19 LAB — RPR: RPR: NONREACTIVE

## 2015-02-26 ENCOUNTER — Other Ambulatory Visit: Payer: Self-pay | Admitting: Hematology

## 2016-04-01 ENCOUNTER — Encounter: Payer: Self-pay | Admitting: Emergency Medicine

## 2016-04-01 ENCOUNTER — Inpatient Hospital Stay (HOSPITAL_COMMUNITY): Payer: Medicaid Other

## 2016-04-01 ENCOUNTER — Inpatient Hospital Stay
Admission: EM | Admit: 2016-04-01 | Discharge: 2016-04-02 | DRG: 813 | Disposition: A | Discharge status: Other Acute Inpatient Hospital | Payer: Medicaid Other | Attending: Internal Medicine | Admitting: Internal Medicine

## 2016-04-01 ENCOUNTER — Inpatient Hospital Stay: Payer: Medicaid Other

## 2016-04-01 ENCOUNTER — Emergency Department: Payer: Medicaid Other

## 2016-04-01 DIAGNOSIS — E871 Hypo-osmolality and hyponatremia: Secondary | ICD-10-CM | POA: Diagnosis present

## 2016-04-01 DIAGNOSIS — K920 Hematemesis: Secondary | ICD-10-CM

## 2016-04-01 DIAGNOSIS — D696 Thrombocytopenia, unspecified: Secondary | ICD-10-CM

## 2016-04-01 DIAGNOSIS — R109 Unspecified abdominal pain: Secondary | ICD-10-CM

## 2016-04-01 DIAGNOSIS — D693 Immune thrombocytopenic purpura: Principal | ICD-10-CM | POA: Diagnosis present

## 2016-04-01 DIAGNOSIS — E1165 Type 2 diabetes mellitus with hyperglycemia: Secondary | ICD-10-CM | POA: Diagnosis present

## 2016-04-01 DIAGNOSIS — Z9114 Patient's other noncompliance with medication regimen: Secondary | ICD-10-CM | POA: Diagnosis not present

## 2016-04-01 DIAGNOSIS — R112 Nausea with vomiting, unspecified: Secondary | ICD-10-CM

## 2016-04-01 DIAGNOSIS — G40909 Epilepsy, unspecified, not intractable, without status epilepticus: Secondary | ICD-10-CM | POA: Diagnosis present

## 2016-04-01 DIAGNOSIS — N179 Acute kidney failure, unspecified: Secondary | ICD-10-CM

## 2016-04-01 DIAGNOSIS — Z7984 Long term (current) use of oral hypoglycemic drugs: Secondary | ICD-10-CM | POA: Diagnosis not present

## 2016-04-01 DIAGNOSIS — E079 Disorder of thyroid, unspecified: Secondary | ICD-10-CM | POA: Diagnosis present

## 2016-04-01 DIAGNOSIS — Z888 Allergy status to other drugs, medicaments and biological substances status: Secondary | ICD-10-CM | POA: Diagnosis not present

## 2016-04-01 DIAGNOSIS — D594 Other nonautoimmune hemolytic anemias: Secondary | ICD-10-CM | POA: Diagnosis present

## 2016-04-01 DIAGNOSIS — R17 Unspecified jaundice: Secondary | ICD-10-CM | POA: Diagnosis present

## 2016-04-01 DIAGNOSIS — M311 Thrombotic microangiopathy, unspecified: Secondary | ICD-10-CM

## 2016-04-01 DIAGNOSIS — N390 Urinary tract infection, site not specified: Secondary | ICD-10-CM | POA: Diagnosis present

## 2016-04-01 DIAGNOSIS — Z880 Allergy status to penicillin: Secondary | ICD-10-CM | POA: Diagnosis not present

## 2016-04-01 HISTORY — DX: Disorder of thyroid, unspecified: E07.9

## 2016-04-01 LAB — URINALYSIS COMPLETE WITH MICROSCOPIC (ARMC ONLY)
BILIRUBIN URINE: NEGATIVE
Glucose, UA: >500 mg/dL — AB
Leukocytes, UA: NEGATIVE
NITRITE: NEGATIVE
SPECIFIC GRAVITY, URINE: 1.018 (ref 1.005–1.030)
pH: 6 (ref 5.0–8.0)

## 2016-04-01 LAB — CBC WITH DIFFERENTIAL/PLATELET
Basophils Absolute: 0 10*3/uL (ref 0–0.1)
Basophils Relative: 0 %
EOS ABS: 0 10*3/uL (ref 0–0.7)
EOS PCT: 0 %
HCT: 32.3 % — ABNORMAL LOW (ref 35.0–47.0)
Hemoglobin: 11.7 g/dL — ABNORMAL LOW (ref 12.0–16.0)
LYMPHS ABS: 0.9 10*3/uL — AB (ref 1.0–3.6)
Lymphocytes Relative: 13 %
MCH: 30.4 pg (ref 26.0–34.0)
MCHC: 36.3 g/dL — AB (ref 32.0–36.0)
MCV: 83.8 fL (ref 80.0–100.0)
MONOS PCT: 10 %
Monocytes Absolute: 0.7 10*3/uL (ref 0.2–0.9)
Neutro Abs: 5.7 10*3/uL (ref 1.4–6.5)
Neutrophils Relative %: 77 %
PLATELETS: 11 10*3/uL — AB (ref 150–440)
RBC Morphology: NONE SEEN
RBC: 3.86 MIL/uL (ref 3.80–5.20)
RDW: 13 % (ref 11.5–14.5)
WBC: 7.4 10*3/uL (ref 3.6–11.0)

## 2016-04-01 LAB — FIBRINOGEN: FIBRINOGEN: 443 mg/dL (ref 210–475)

## 2016-04-01 LAB — GLUCOSE, CAPILLARY
GLUCOSE-CAPILLARY: 494 mg/dL — AB (ref 65–99)
GLUCOSE-CAPILLARY: 462 mg/dL — AB (ref 65–99)
GLUCOSE-CAPILLARY: 434 mg/dL — AB (ref 65–99)
Glucose-Capillary: 438 mg/dL — ABNORMAL HIGH (ref 65–99)
Glucose-Capillary: 442 mg/dL — ABNORMAL HIGH (ref 65–99)

## 2016-04-01 LAB — PREGNANCY, URINE: PREG TEST UR: NEGATIVE

## 2016-04-01 LAB — BASIC METABOLIC PANEL
ANION GAP: 13 (ref 5–15)
BUN: 52 mg/dL — AB (ref 6–20)
CHLORIDE: 99 mmol/L — AB (ref 101–111)
CO2: 17 mmol/L — ABNORMAL LOW (ref 22–32)
Calcium: 8.4 mg/dL — ABNORMAL LOW (ref 8.9–10.3)
Creatinine, Ser: 1.93 mg/dL — ABNORMAL HIGH (ref 0.44–1.00)
GFR calc Af Amer: 41 mL/min — ABNORMAL LOW (ref >60)
GFR, EST NON AFRICAN AMERICAN: 35 mL/min — AB (ref >60)
Glucose, Bld: 591 mg/dL (ref 65–99)
POTASSIUM: 4.4 mmol/L (ref 3.5–5.1)
SODIUM: 129 mmol/L — AB (ref 135–145)

## 2016-04-01 LAB — COMPREHENSIVE METABOLIC PANEL
ALT: 19 U/L (ref 14–54)
AST: 64 U/L — ABNORMAL HIGH (ref 15–41)
Albumin: 4.4 g/dL (ref 3.5–5.0)
Alkaline Phosphatase: 52 U/L (ref 38–126)
Anion gap: 11 (ref 5–15)
BUN: 42 mg/dL — ABNORMAL HIGH (ref 6–20)
CHLORIDE: 100 mmol/L — AB (ref 101–111)
CO2: 22 mmol/L (ref 22–32)
CREATININE: 1.87 mg/dL — AB (ref 0.44–1.00)
Calcium: 9.6 mg/dL (ref 8.9–10.3)
GFR, EST AFRICAN AMERICAN: 42 mL/min — AB (ref >60)
GFR, EST NON AFRICAN AMERICAN: 36 mL/min — AB (ref >60)
Glucose, Bld: 377 mg/dL — ABNORMAL HIGH (ref 65–99)
POTASSIUM: 3.9 mmol/L (ref 3.5–5.1)
Sodium: 133 mmol/L — ABNORMAL LOW (ref 135–145)
Total Bilirubin: 6.1 mg/dL — ABNORMAL HIGH (ref 0.3–1.2)
Total Protein: 7.8 g/dL (ref 6.5–8.1)

## 2016-04-01 LAB — LACTATE DEHYDROGENASE: LDH: 1797 U/L — AB (ref 98–192)

## 2016-04-01 LAB — PROTIME-INR
INR: 1.17
Prothrombin Time: 15 seconds (ref 11.4–15.2)

## 2016-04-01 LAB — VALPROIC ACID LEVEL: Valproic Acid Lvl: <10 ug/mL — ABNORMAL LOW (ref 50.0–100.0)

## 2016-04-01 LAB — APTT: APTT: 34 s (ref 24–36)

## 2016-04-01 LAB — ABO/RH: ABO/RH(D): O POS

## 2016-04-01 MED ORDER — FENTANYL CITRATE (PF) 100 MCG/2ML IJ SOLN
50.0000 ug | INTRAMUSCULAR | Status: DC | PRN
Start: 2016-04-01 — End: 2016-04-02
  Administered 2016-04-01: 50 ug via INTRAVENOUS

## 2016-04-01 MED ORDER — INSULIN ASPART 100 UNIT/ML ~~LOC~~ SOLN
10.0000 [IU] | Freq: Once | SUBCUTANEOUS | Status: AC
  Administered 2016-04-01: 10 [IU] via INTRAVENOUS
  Filled 2016-04-01: qty 10

## 2016-04-01 MED ORDER — INSULIN ASPART 100 UNIT/ML ~~LOC~~ SOLN
5.0000 [IU] | Freq: Once | SUBCUTANEOUS | Status: AC
  Administered 2016-04-01: 5 [IU] via INTRAVENOUS
  Filled 2016-04-01: qty 5

## 2016-04-01 MED ORDER — PROMETHAZINE HCL 25 MG/ML IJ SOLN
12.5000 mg | Freq: Once | INTRAMUSCULAR | Status: AC
Start: 2016-04-01 — End: 2016-04-01
  Administered 2016-04-01: 12.5 mg via INTRAVENOUS
  Filled 2016-04-01: qty 1

## 2016-04-01 MED ORDER — INSULIN ASPART 100 UNIT/ML ~~LOC~~ SOLN
0.0000 [IU] | Freq: Three times a day (TID) | SUBCUTANEOUS | Status: DC
  Filled 2016-04-01: qty 10

## 2016-04-01 MED ORDER — ONDANSETRON HCL 4 MG/2ML IJ SOLN
INTRAMUSCULAR | Status: AC
  Filled 2016-04-01: qty 2

## 2016-04-01 MED ORDER — SODIUM CHLORIDE 0.9 % IV BOLUS (SEPSIS)
1000.0000 mL | Freq: Once | INTRAVENOUS | Status: AC
  Administered 2016-04-01: 1000 mL via INTRAVENOUS

## 2016-04-01 MED ORDER — PROMETHAZINE HCL 25 MG/ML IJ SOLN
12.5000 mg | Freq: Once | INTRAMUSCULAR | Status: AC
  Administered 2016-04-01: 12.5 mg via INTRAVENOUS
  Filled 2016-04-01: qty 1

## 2016-04-01 MED ORDER — METOCLOPRAMIDE HCL 5 MG/ML IJ SOLN
10.0000 mg | Freq: Once | INTRAMUSCULAR | Status: AC
  Administered 2016-04-01: 10 mg via INTRAVENOUS
  Filled 2016-04-01: qty 2

## 2016-04-01 MED ORDER — ONDANSETRON HCL 4 MG/2ML IJ SOLN
4.0000 mg | Freq: Once | INTRAMUSCULAR | Status: AC
  Administered 2016-04-01: 4 mg via INTRAVENOUS

## 2016-04-01 MED ORDER — INSULIN ASPART 100 UNIT/ML ~~LOC~~ SOLN
15.0000 [IU] | Freq: Once | SUBCUTANEOUS | Status: AC
  Administered 2016-04-02: 15 [IU] via SUBCUTANEOUS
  Filled 2016-04-01: qty 15

## 2016-04-01 MED ORDER — CIPROFLOXACIN IN D5W 400 MG/200ML IV SOLN
400.0000 mg | Freq: Two times a day (BID) | INTRAVENOUS | Status: DC
  Administered 2016-04-01 – 2016-04-02 (×2): 400 mg via INTRAVENOUS
  Filled 2016-04-01 (×4): qty 200

## 2016-04-01 MED ORDER — INSULIN ASPART 100 UNIT/ML ~~LOC~~ SOLN
10.0000 [IU] | Freq: Once | SUBCUTANEOUS | Status: AC
  Administered 2016-04-01: 10 [IU] via SUBCUTANEOUS

## 2016-04-01 MED ORDER — INSULIN ASPART 100 UNIT/ML ~~LOC~~ SOLN
0.0000 [IU] | Freq: Three times a day (TID) | SUBCUTANEOUS | Status: DC
  Administered 2016-04-02 (×2): 11 [IU] via SUBCUTANEOUS
  Filled 2016-04-01 (×2): qty 11

## 2016-04-01 MED ORDER — METHYLPREDNISOLONE SODIUM SUCC 125 MG IJ SOLR
125.0000 mg | Freq: Once | INTRAMUSCULAR | Status: AC
  Administered 2016-04-01: 125 mg via INTRAVENOUS
  Filled 2016-04-01: qty 2

## 2016-04-01 MED ORDER — METHYLPREDNISOLONE SODIUM SUCC 125 MG IJ SOLR
80.0000 mg | Freq: Two times a day (BID) | INTRAMUSCULAR | Status: DC
  Administered 2016-04-01 – 2016-04-02 (×2): 80 mg via INTRAVENOUS
  Filled 2016-04-01 (×3): qty 2

## 2016-04-01 MED ORDER — FENTANYL CITRATE (PF) 100 MCG/2ML IJ SOLN
INTRAMUSCULAR | Status: AC
  Administered 2016-04-01: 50 ug via INTRAVENOUS
  Filled 2016-04-01: qty 2

## 2016-04-01 MED ORDER — IOPAMIDOL (ISOVUE-300) INJECTION 61%: 15.0000 mL | INTRAVENOUS | Status: AC

## 2016-04-01 NOTE — ED Notes (Signed)
Patient asking for food since she was able to tolerate water. Spoke with Dr Allena KatzPatel. Patient can have graham crackers and juice only. Gave patient graham crackers and apple juice.

## 2016-04-01 NOTE — ED Notes (Signed)
Pt given crackers and pt vomiting in room again.

## 2016-04-01 NOTE — ED Notes (Signed)
Ultrasound called patient was vomiting. Spoke with Dr Roxan Hockeyobinson put in order for reglan. Given to patient in ultrasound. No blood in vomit.

## 2016-04-01 NOTE — ED Notes (Signed)
Patient transported to CT 

## 2016-04-01 NOTE — ED Notes (Signed)
Patient asking for something besides ice chips. Gave patient some water since diet ginger ale did not stay down. Patient is tolerating water at this time.

## 2016-04-01 NOTE — ED Notes (Signed)
Patient and boyfriend told that since patient vomited with eating and having juice water is all patient can have at this time since it was well tolerated before.

## 2016-04-01 NOTE — ED Notes (Signed)
Pt's mother to nurse's station stating that pt noticed BRBPR when up to the toilet to have a BM. Dr  Anne HahnWillis made aware.

## 2016-04-01 NOTE — ED Triage Notes (Signed)
Says she ate some grapes with mold on them.  Has  Been vomiting all night and shaking.  Pt is pale.  Skin warm and dry.

## 2016-04-01 NOTE — ED Notes (Signed)
Patient signed consent for platelet transfusion.

## 2016-04-01 NOTE — H&P (Addendum)
[facility] -  at [facility]   PATIENT NAME: Traci ShellerBrenda Gibson    MR#:  [id number]  DATE OF BIRTH:  08/12/1990  DATE OF ADMISSION:  04/01/2016  PRIMARY CARE PHYSICIAN: Douglass RiversHAMMAN,CHELSEA, MD   REQUESTING/REFERRING PHYSICIAN: Dr Roxan Hockeyobinson  CHIEF COMPLAINT:  Intractable nausea vomiting for 2 days. Abdominal pain.  HISTORY OF PRESENT ILLNESS:  Traci ShellerBrenda Gibson  is a 25 y.o. female with a known history of Diabetes and seizures(not on anti seizure medication) comes to the emergency room after she started having intractable nausea vomiting that started last night. Patient said she had eaten some moldy grapes and about to keep anything overnight since then denies any fever. She also noticed some rash in her lower extremity that has been there for 2 days. She denies any new medication. She denies any recent illness chest pressure. She denies any recent surgery. Denies any history of bleeding disorder in the past. She had history of heavy menstrual cycle for which she underwent surgery in the remote past.  Patient was noted to have platelet count of 11,000 with elevated total bilirubin of 6.1 and petechial rash in both lower extremities. Further workup showed LDH of 1700. Dr. Roxan Hockeyobinson called lab her efforts smear did not show any schistocytes. Case was discussed with hematology. Patient is being admitted with acute thrombocytopenia. She received a dose of Solu-Medrol 125 mg 1 in the ER.   PAST MEDICAL HISTORY:   Past Medical History:  Diagnosis Date  . Diabetes mellitus without complication (HCC)   . Seizures (HCC)     PAST SURGICAL HISTOIRY:   Past Surgical History:  Procedure Laterality Date  . ABDOMINAL SURGERY      SOCIAL HISTORY:   Social History  Substance Use Topics  . Smoking status: Never Smoker  . Smokeless tobacco: Never Used  . Alcohol use No    FAMILY HISTORY:  No family history on file.  DRUG ALLERGIES:   Allergies  Allergen Reactions  .  Abilify [Aripiprazole] Other (See Comments)    "causes my joints to lock up"  . Penicillins Swelling    REVIEW OF SYSTEMS:  Review of Systems  Constitutional: Positive for malaise/fatigue. Negative for chills, fever and weight loss.  HENT: Negative for ear discharge, ear pain and nosebleeds.   Eyes: Negative for blurred vision, pain and discharge.  Respiratory: Negative for sputum production, shortness of breath, wheezing and stridor.   Cardiovascular: Negative for chest pain, palpitations, orthopnea and PND.  Gastrointestinal: Positive for abdominal pain, nausea and vomiting. Negative for diarrhea.  Genitourinary: Negative for frequency and urgency.  Musculoskeletal: Negative for back pain and joint pain.  Neurological: Positive for weakness. Negative for sensory change, speech change and focal weakness.  Psychiatric/Behavioral: Negative for depression and hallucinations. The patient is not nervous/anxious.      MEDICATIONS AT HOME:   Prior to Admission medications   Medication Sig Start Date End Date Taking? Authorizing Provider  metFORMIN (GLUCOPHAGE) 1000 MG tablet Take 1,000 mg by mouth 2 (two) times daily. 02/04/14   Historical Provider, MD      VITAL SIGNS:  Blood pressure 91/67, pulse 95, temperature 98.5 F (36.9 C), temperature source Oral, resp. rate 17, height 5\' 2"  (1.575 m), weight 56.7 kg (125 lb), SpO2 100 %.  PHYSICAL EXAMINATION:  GENERAL:  25 y.o.-year-old patient lying in the bed with Mild  acute distress.  EYES: Pupils equal, round, reactive to light and accommodation. +++scleral icterus. Extraocular muscles intact.  HEENT: Head atraumatic, normocephalic. Oropharynx  and nasopharynx clear.  NECK:  Supple, no jugular venous distention. No thyroid enlargement, no tenderness.  LUNGS: Normal breath sounds bilaterally, no wheezing, rales,rhonchi or crepitation. No use of accessory muscles of respiration.  CARDIOVASCULAR: S1, S2 normal. No murmurs, rubs, or gallops.  Tachycardia  ABDOMEN: Soft, diffusely tender, nondistended. Bowel sounds present. No organomegaly or mass.  EXTREMITIES: No pedal edema, cyanosis, or clubbing.  NEUROLOGIC: Cranial nerves II through XII are intact. Muscle strength 5/5 in all extremities. Sensation intact. Gait not checked.  PSYCHIATRIC: The patient is alert and oriented x 3.  SKIN:Petechial rash bilateral lower extremity  LABORATORY PANEL:   CBC  Recent Labs Lab 04/01/16 0828  WBC 7.4  HGB 11.7*  HCT 32.3*  PLT 11*   ------------------------------------------------------------------------------------------------------------------  Chemistries   Recent Labs Lab 04/01/16 0828  NA 133*  K 3.9  CL 100*  CO2 22  GLUCOSE 377*  BUN 42*  CREATININE 1.87*  CALCIUM 9.6  AST 64*  ALT 19  ALKPHOS 52  BILITOT 6.1*   ------------------------------------------------------------------------------------------------------------------  Cardiac Enzymes No results for input(s): TROPONINI in the last 168 hours. ------------------------------------------------------------------------------------------------------------------  RADIOLOGY:  Dg Chest Portable 1 View  Result Date: 04/01/2016 CLINICAL DATA:  Vomiting EXAM: PORTABLE CHEST 1 VIEW COMPARISON:  None. FINDINGS: No active infiltrate or effusion is seen. Mediastinal and hilar contours are unremarkable. The heart is within normal limits in size. IMPRESSION: No active disease. Electronically Signed   By: Dwyane DeePaul  Barry M.D.   On: 04/01/2016 12:00   [place] Abdomen Limited Ruq  Result Date: 04/01/2016 CLINICAL DATA:  Nausea and vomiting over the last day. EXAM: US ABDOMEN LIMITED - RIGHT UPPER QUADRANT COMPARISON:  CT 05/29/2007 FINDINGS: Gallbladder: No gallstones or wall thickening visualized. No sonographic Murphy sign noted by sonographer. Common bile duct: Diameter: 3 mm, normal Liver: No focal lesion identified. Within normal limits in parenchymal echogenicity.  IMPRESSION: Normal right upper quadrant ultrasound. No abnormality seen to explain the clinical presentation. Electronically Signed   By: Paulina FusiMark  Shogry M.D.   On: 04/01/2016 10:29    EKG:    IMPRESSION AND PLAN:   Traci ShellerBrenda Gibson  is a 25 y.o. female with a known history of Diabetes and seizures(not on anti seizure medication) comes to the emergency room after she started having intractable nausea vomiting that started last night. Patient said she had eaten some moldy grapes and about to keep anything overnight since then denies any fever. She also noticed some rash in her lower extremity that has been there for 2 days.   1. Acute thrombocytopenia -Patient presented with intractable nausea vomiting jaundice elevated LDH and a severely low platelet count with petechial rash bilateral lower extremity -Etiology unclear could be ITP versus TMA -Less likely TTP since no sister site seen on the peripheral smear -Oncology consultation placed -CT abdomen pending -IV Solu-Medrol -Patient received 1 unit of platelet transfusion we'll continue to transfuse as needed  2. Type 2 diabetes -Patient takes metformin which I'll hold it since creatinine is up unable to keep anything orally and nothing by mouth -Sliding-scale insulin  3. Intractable nausea vomiting  And Acute renal failure due to GI volume losses -When necessary Zofran and/or Phenergan -CT abdomen shows bilateral perinephric stranding. UA shows many bacteria 6-30 WBC and to many to count RBCs -We'll treat for urinary tract infection with IV Rocephin -Avoid nephrotoxins -Monitor I's and O's and creatinine  4. DVT prophylaxis SCDs -No antiplatelet secondary to acute from cytopenia  No family present in the emergency  room. Case discussed with ER physician.  All the records are reviewed and case discussed with ED provider. Management plans discussed with the patient, family and they are in agreement.  CODE STATUS: Full  TOTAL TIME TAKING  CARE OF THIS PATIENT: 50  minutes.    Codie Krogh M.D on 04/01/2016 at 1:36 PM  Between 7am to 6pm - Pager - [phone]  After 6pm go to [email or web address] - password [email or web address] [facility]  [facility]  Office  [phone]  CC: Primary care physician; Douglass Rivers, MD

## 2016-04-01 NOTE — ED Notes (Signed)
Spoke with Dr.Patel, pt needs higher level of inpatient care. Stepdown bed requested.

## 2016-04-01 NOTE — Progress Notes (Addendum)
Patient appears hemodynamically stable. She is able to tolerate ice chips. No vomiting for the last hour. I will change her to MedSurg floor. She does not require stepdown at present.  Ordered IV cipro for UTI. Patient is allergic to penicillin which causes swelling since I'm avoiding cephalosporins.  Patient's ultrasound of the abdomen does not show any gallbladder abnormality.

## 2016-04-01 NOTE — ED Provider Notes (Addendum)
[facility] Emergency Department Provider Note    First MD Initiated Contact with Patient 04/01/16 0809     (approximate)  I have reviewed the triage vital signs and the nursing notes.   HISTORY  Chief Complaint Emesis    HPI Traci Gibson is a 25 y.o. female with a history of seizures and as well as diabetes on metformin presents with complaint of nausea and vomiting that started last night after the patient states that she ate moldy grapes. Patient has been unable to keep any food down overnight. Denies any fevers. States that she's also noticed a rash creep up over the past 2 days. Denies any new medications. Denies any fevers. Denies any chest pain or pressure. States that she did have one episode of red emesis.  She does report having GU surgery secondary to heavy menses associated with intractable bleeding several years ago. No recent surgeries. She's never seen a hematologist before.   Past Medical History:  Diagnosis Date  . Diabetes mellitus without complication (HCC)   . Seizures (HCC)    No family history on file. Past Surgical History:  Procedure Laterality Date  . ABDOMINAL SURGERY     Patient Active Problem List   Diagnosis Date Noted  . Acute idiopathic thrombocytopenic purpura (HCC) 04/01/2016      Prior to Admission medications   Medication Sig Start Date End Date Taking? Authorizing Provider  metFORMIN (GLUCOPHAGE) 1000 MG tablet Take 1,000 mg by mouth 2 (two) times daily. 02/04/14   Historical Provider, MD    Allergies Abilify [aripiprazole] and Penicillins    Social History Social History  Substance Use Topics  . Smoking status: Never Smoker  . Smokeless tobacco: Never Used  . Alcohol use No    Review of Systems Patient denies headaches, rhinorrhea, blurry vision, numbness, shortness of breath, chest pain, edema, cough, abdominal pain, nausea, vomiting, diarrhea, dysuria, fevers, rashes or hallucinations unless  otherwise stated above in HPI. ____________________________________________   PHYSICAL EXAM:  VITAL SIGNS: Vitals:   04/01/16 1330 04/01/16 1400  BP: 98/75 100/77  Pulse: 92 (!) 106  Resp:    Temp:      Constitutional: Alert and oriented.  in no acute distress. Eyes: Scleral icterus l. PERRL. EOMI. Head: Atraumatic. Nose: No congestion/rhinnorhea. Mouth/Throat: Mucous membranes are moist.  Oropharynx non-erythematous. Neck: No stridor. Painless ROM. No cervical spine tenderness to palpation Hematological/Lymphatic/Immunilogical: No cervical lymphadenopathy. Cardiovascular: Normal rate, regular rhythm. Grossly normal heart sounds.  Good peripheral circulation. Respiratory: Normal respiratory effort.  No retractions. Lungs CTAB. Gastrointestinal: Soft with right upper quadrant tenderness to palpation. No distention. No abdominal bruits. No CVA tenderness. Musculoskeletal: No lower extremity tenderness nor edema.  No joint effusions. Neurologic:  Normal speech and language. No gross focal neurologic deficits are appreciated. No gait instability. Skin:  Skin is warm, dry and intact. Scattered petechia bilateral lower extremities Psychiatric: Mood and affect are normal. Speech and behavior are normal.  ____________________________________________   LABS (all labs ordered are listed, but only abnormal results are displayed)  Results for orders placed or performed during the hospital encounter of 04/01/16 (from the past 24 hour(s))  CBC with Differential/Platelet     Status: Abnormal   Collection Time: 04/01/16  8:28 AM  Result Value Ref Range   WBC 7.4 3.6 - 11.0 K/uL   RBC 3.86 3.80 - 5.20 MIL/uL   Hemoglobin 11.7 (L) 12.0 - 16.0 g/dL   HCT 16.1 (L) 09.6 - 04.5 %  MCV 83.8 80.0 - 100.0 fL   MCH 30.4 26.0 - 34.0 pg   MCHC 36.3 (H) 32.0 - 36.0 g/dL   RDW 09.8 11.9 - 14.7 %   Platelets 11 (LL) 150 - 440 K/uL   Neutrophils Relative % 77 %   Neutro Abs 5.7 1.4 - 6.5 K/uL    Lymphocytes Relative 13 %   Lymphs Abs 0.9 (L) 1.0 - 3.6 K/uL   Monocytes Relative 10 %   Monocytes Absolute 0.7 0.2 - 0.9 K/uL   Eosinophils Relative 0 %   Eosinophils Absolute 0.0 0 - 0.7 K/uL   Basophils Relative 0 %   Basophils Absolute 0.0 0 - 0.1 K/uL   RBC Morphology NO SCHISTOCYTES SEEN   Comprehensive metabolic panel     Status: Abnormal   Collection Time: 04/01/16  8:28 AM  Result Value Ref Range   Sodium 133 (L) 135 - 145 mmol/L   Potassium 3.9 3.5 - 5.1 mmol/L   Chloride 100 (L) 101 - 111 mmol/L   CO2 22 22 - 32 mmol/L   Glucose, Bld 377 (H) 65 - 99 mg/dL   BUN 42 (H) 6 - 20 mg/dL   Creatinine, Ser 8.29 (H) 0.44 - 1.00 mg/dL   Calcium 9.6 8.9 - 56.2 mg/dL   Total Protein 7.8 6.5 - 8.1 g/dL   Albumin 4.4 3.5 - 5.0 g/dL   AST 64 (H) 15 - 41 U/L   ALT 19 14 - 54 U/L   Alkaline Phosphatase 52 38 - 126 U/L   Total Bilirubin 6.1 (H) 0.3 - 1.2 mg/dL   GFR calc non Af Amer 36 (L) >60 mL/min   GFR calc Af Amer 42 (L) >60 mL/min   Anion gap 11 5 - 15  Urinalysis complete, with microscopic ([facility] only)     Status: Abnormal   Collection Time: 04/01/16  8:28 AM  Result Value Ref Range   Color, Urine YELLOW (A) YELLOW   APPearance HAZY (A) CLEAR   Glucose, UA >500 (A) NEGATIVE mg/dL   Bilirubin Urine NEGATIVE NEGATIVE   Ketones, ur TRACE (A) NEGATIVE mg/dL   Specific Gravity, Urine 1.018 1.005 - 1.030   Hgb urine dipstick 3+ (A) NEGATIVE   pH 6.0 5.0 - 8.0   Protein, ur >500 (A) NEGATIVE mg/dL   Nitrite NEGATIVE NEGATIVE   Leukocytes, UA NEGATIVE NEGATIVE   RBC / HPF TOO NUMEROUS TO COUNT 0 - 5 RBC/hpf   WBC, UA 6-30 0 - 5 WBC/hpf   Bacteria, UA MANY (A) NONE SEEN   Squamous Epithelial / LPF 6-30 (A) NONE SEEN   Mucous PRESENT   Protime-INR     Status: None   Collection Time: 04/01/16  8:28 AM  Result Value Ref Range   Prothrombin Time 15.0 11.4 - 15.2 seconds   INR 1.17   APTT     Status: None   Collection Time: 04/01/16  8:28 AM  Result Value Ref Range   aPTT 34  24 - 36 seconds  Fibrinogen     Status: None   Collection Time: 04/01/16  8:28 AM  Result Value Ref Range   Fibrinogen 443 210 - 475 mg/dL  ABO/Rh     Status: None   Collection Time: 04/01/16  8:28 AM  Result Value Ref Range   ABO/RH(D) O POS   Lactate dehydrogenase     Status: Abnormal   Collection Time: 04/01/16  8:28 AM  Result Value Ref Range   LDH 1,797 (H) 98 -  192 U/L  Valproic acid level     Status: Abnormal   Collection Time: 04/01/16  8:28 AM  Result Value Ref Range   Valproic Acid Lvl <10 (L) 50.0 - 100.0 ug/mL  Pregnancy, urine     Status: None   Collection Time: 04/01/16  8:28 AM  Result Value Ref Range   Preg Test, Ur NEGATIVE NEGATIVE  Glucose, capillary     Status: Abnormal   Collection Time: 04/01/16  8:30 AM  Result Value Ref Range   Glucose-Capillary 442 (H) 65 - 99 mg/dL  Prepare pheresed platelets     Status: None (Preliminary result)   Collection Time: 04/01/16  9:39 AM  Result Value Ref Range   ISSUE DATE / TIME [id number]    Blood Product Unit Number [id number]    PRODUCT CODE [id number]    Unit Type and Rh 6200    Blood Product Expiration Date 08-07-172359    ____________________________________________  EKG____________________________________________  RADIOLOGY  I personally reviewed all radiographic images ordered to evaluate for the above acute complaints and reviewed radiology reports and findings.  These findings were personally discussed with the patient.  Please see medical record for radiology report.  ____________________________________________   PROCEDURES  Procedure(s) performed: none Procedures    Critical Care performed:Yes CRITICAL CARE Performed by: Willy EddyPatrick Zarea Diesing   Total critical care time: 47 minutes  Critical care time was exclusive of separately billable procedures and treating other patients.  Critical care was necessary to treat or prevent imminent or life-threatening deterioration.  Critical care was  time spent personally by me on the following activities: development of treatment plan with patient and/or surrogate as well as nursing, discussions with consultants, evaluation of patient's response to treatment, examination of patient, obtaining history from patient or surrogate, ordering and performing treatments and interventions, ordering and review of laboratory studies, ordering and review of radiographic studies, pulse oximetry and re-evaluation of patient's condition.  ____________________________________________   INITIAL IMPRESSION / ASSESSMENT AND PLAN / ED COURSE  Pertinent labs & imaging results that were available during my care of the patient were reviewed by me and considered in my medical decision making (see chart for details).  DDX: TTP, ITP, HUS, MAHA, hepatitis, renal failure, cholecystitis  Traci Gibson is a 25 y.o. who presents to the ED with complaint of nausea and vomiting. On presentation patient does appear jaundiced with evidence of petechia. I am concerned for the above differential. We'll provide IV fluids. Will evaluate for any coagulopathy or DIC. We'll order a right upper quadrant ultrasound due to concern for obstructive jaundice.  The patient will be placed on continuous pulse oximetry and telemetry for monitoring.  Laboratory evaluation will be sent to evaluate for the above complaints.     Clinical Course as of Apr 01 1505  Thu Apr 01, 2016  1025 Protime-INR [PR]  1119 Ultrasound without any evidence of obstructive process. 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 Based on timing and evidence of acute renal failure more consistent with possible underlying drug induced TMA versus idiopathic or immune mediated. Patient receiving IV fluids. Remains afebrile.  Spoke with Dr. Merlene Pullingorcoran who states if no evidence of schistocytes on smear, patient appropriate for admission and further management at this facility.  If schistocytes present,  concerning for TTP and will require transfer for PEX.    [PR]  1135 LDH: (!) 1,797 [PR]  1223 No evidence of schistocytes at this time. We'll continue to monitor patient. She is currently receiving  IV platelets as well as IV steroids. Remains hemodynamically stable.  [PR]    Clinical Course User Index [PR] Willy EddyPatrick Paul Trettin, MD   ----------------------------------------- 12:56 PM on 04/01/2016 -----------------------------------------  Patient will require admission for further evaluation and management. She is having persistent nausea and vomiting but at this time does not have any hematemesis. Had low-grade fever during initiation of platelet transfusion. a ptosis was consistent with acute infectious process.  ____________________________________________   FINAL CLINICAL IMPRESSION(S) / ED DIAGNOSES  Final diagnoses:  Nausea & vomiting  Thrombotic microangiopathy (HCC)  AKI (acute kidney injury) (HCC)  Thrombocytopenia (HCC)  Hematemesis with nausea      NEW MEDICATIONS STARTED DURING THIS VISIT:  New Prescriptions   No medications on file     Note:  This document was prepared using Dragon voice recognition software and may include unintentional dictation errors.    Willy EddyPatrick Garrie Elenes, MD 04/01/16 1506    Willy EddyPatrick Rodnisha Blomgren, MD 04/02/16 2102

## 2016-04-02 LAB — BASIC METABOLIC PANEL
ANION GAP: 9 (ref 5–15)
BUN: 52 mg/dL — ABNORMAL HIGH (ref 6–20)
CALCIUM: 8.3 mg/dL — AB (ref 8.9–10.3)
CO2: 17 mmol/L — ABNORMAL LOW (ref 22–32)
Chloride: 102 mmol/L (ref 101–111)
Creatinine, Ser: 1.55 mg/dL — ABNORMAL HIGH (ref 0.44–1.00)
GFR, EST AFRICAN AMERICAN: 53 mL/min — AB (ref >60)
GFR, EST NON AFRICAN AMERICAN: 46 mL/min — AB (ref >60)
Glucose, Bld: 410 mg/dL — ABNORMAL HIGH (ref 65–99)
POTASSIUM: 4.2 mmol/L (ref 3.5–5.1)
SODIUM: 128 mmol/L — AB (ref 135–145)

## 2016-04-02 LAB — GLUCOSE, CAPILLARY
GLUCOSE-CAPILLARY: 345 mg/dL — AB (ref 65–99)
GLUCOSE-CAPILLARY: 427 mg/dL — AB (ref 65–99)
GLUCOSE-CAPILLARY: 476 mg/dL — AB (ref 65–99)
Glucose-Capillary: 342 mg/dL — ABNORMAL HIGH (ref 65–99)
Glucose-Capillary: 380 mg/dL — ABNORMAL HIGH (ref 65–99)
Glucose-Capillary: 424 mg/dL — ABNORMAL HIGH (ref 65–99)
Glucose-Capillary: 449 mg/dL — ABNORMAL HIGH (ref 65–99)
Glucose-Capillary: 452 mg/dL — ABNORMAL HIGH (ref 65–99)

## 2016-04-02 LAB — CBC
HEMATOCRIT: 29.5 % — AB (ref 35.0–47.0)
HEMOGLOBIN: 10.3 g/dL — AB (ref 12.0–16.0)
MCH: 28.8 pg (ref 26.0–34.0)
MCHC: 35 g/dL (ref 32.0–36.0)
MCV: 82.2 fL (ref 80.0–100.0)
Platelets: 16 10*3/uL — CL (ref 150–440)
RBC: 3.59 MIL/uL — ABNORMAL LOW (ref 3.80–5.20)
RDW: 14 % (ref 11.5–14.5)
WBC: 10.7 10*3/uL (ref 3.6–11.0)

## 2016-04-02 LAB — HEMOGLOBIN: Hemoglobin: 8.8 g/dL — ABNORMAL LOW (ref 12.0–16.0)

## 2016-04-02 LAB — PREPARE RBC (CROSSMATCH)

## 2016-04-02 LAB — PREPARE PLATELET PHERESIS: UNIT DIVISION: 0

## 2016-04-02 LAB — HAPTOGLOBIN: Haptoglobin: <10 mg/dL — ABNORMAL LOW (ref 34–200)

## 2016-04-02 LAB — PATHOLOGIST SMEAR REVIEW

## 2016-04-02 LAB — TSH: TSH: 0.856 u[IU]/mL (ref 0.350–4.500)

## 2016-04-02 MED ORDER — MORPHINE SULFATE (PF) 4 MG/ML IV SOLN
2.0000 mg | Freq: Once | INTRAVENOUS | Status: AC
  Administered 2016-04-02: 10:00:00 2 mg via INTRAVENOUS
  Filled 2016-04-02: qty 1

## 2016-04-02 MED ORDER — SODIUM CHLORIDE 0.9 % IV BOLUS (SEPSIS)
1000.0000 mL | Freq: Once | INTRAVENOUS | Status: AC
  Administered 2016-04-02: 1000 mL via INTRAVENOUS

## 2016-04-02 MED ORDER — SODIUM CHLORIDE 0.9 % IV SOLN
INTRAVENOUS | Status: DC
  Administered 2016-04-02: 07:00:00 via INTRAVENOUS

## 2016-04-02 MED ORDER — SODIUM CHLORIDE 0.9 % IV SOLN
Freq: Once | INTRAVENOUS | Status: AC
  Administered 2016-04-02: 04:00:00 via INTRAVENOUS

## 2016-04-02 MED ORDER — ACETAMINOPHEN 325 MG PO TABS: 650.0000 mg | ORAL_TABLET | Freq: Four times a day (QID) | ORAL | Status: DC | PRN

## 2016-04-02 MED ORDER — ACETAMINOPHEN 325 MG PO TABS
650.0000 mg | ORAL_TABLET | Freq: Once | ORAL | Status: AC
  Administered 2016-04-02: 04:00:00 650 mg via ORAL
  Filled 2016-04-02: qty 2

## 2016-04-02 MED ORDER — INSULIN ASPART 100 UNIT/ML ~~LOC~~ SOLN
15.0000 [IU] | Freq: Once | SUBCUTANEOUS | Status: AC
  Administered 2016-04-02: 15 [IU] via SUBCUTANEOUS
  Filled 2016-04-02: qty 15

## 2016-04-02 MED ORDER — DIPHENHYDRAMINE HCL 25 MG PO CAPS
25.0000 mg | ORAL_CAPSULE | Freq: Once | ORAL | Status: AC
  Administered 2016-04-02: 25 mg via ORAL
  Filled 2016-04-02: qty 1

## 2016-04-02 MED ORDER — INFLUENZA VAC SPLIT QUAD 0.5 ML IM SUSY: 0.5000 mL | PREFILLED_SYRINGE | INTRAMUSCULAR | Status: DC

## 2016-04-02 MED ORDER — ONDANSETRON HCL 4 MG/2ML IJ SOLN
4.0000 mg | Freq: Four times a day (QID) | INTRAMUSCULAR | Status: DC | PRN
  Administered 2016-04-02: 4 mg via INTRAVENOUS
  Filled 2016-04-02: qty 2

## 2016-04-02 MED ORDER — ONDANSETRON HCL 4 MG PO TABS: 4.0000 mg | ORAL_TABLET | Freq: Four times a day (QID) | ORAL | Status: DC | PRN

## 2016-04-02 MED ORDER — ACETAMINOPHEN 650 MG RE SUPP: 650.0000 mg | Freq: Four times a day (QID) | RECTAL | Status: DC | PRN

## 2016-04-02 MED ORDER — PNEUMOCOCCAL VAC POLYVALENT 25 MCG/0.5ML IJ INJ: 0.5000 mL | INJECTION | INTRAMUSCULAR | Status: DC

## 2016-04-02 NOTE — ED Notes (Signed)
Called 1C at 0230 and spoke to the admitting RN, Nettie ElmSylvia, to give report on this pt. I made Nettie ElmSylvia aware that I would be holding this pt in the ED until after her CBG can be rechecked at 0300 as scheduled.

## 2016-04-02 NOTE — ED Notes (Signed)
Discussed POC with Charge RN, Dr Anne HahnWillis, and nurse supervisor Judeth Cornfield(Stephanie) regarding pt's placement on 1C. Dr Anne HahnWillis placed orders to admit pt to general floor despite high blood glucose level and the need for blood transfusion r/t pt's reported drop in hemoglobin.

## 2016-04-02 NOTE — Plan of Care (Signed)
Problem: Education: Goal: Knowledge of [facility] Education information/materials will improve Outcome: Completed/Met Date Met: 04/02/16 Pt to be txr to [facility];  Problem: Safety: Goal: Ability to remain free from injury will improve Outcome: Completed/Met Date Met: 04/02/16 No injury while at [facility], to be txr to [facility]  Problem: Health Behavior/Discharge Planning: Goal: Ability to manage health-related needs will improve Outcome: Not Met (add Reason) Pt to be txr to [facility]  Problem: Pain Managment: Goal: General experience of comfort will improve Outcome: Completed/Met Date Met: 04/02/16 Pain controlled with iv meds. To be txr to [facility]  Problem: Physical Regulation: Goal: Ability to maintain clinical measurements within normal limits will improve Outcome: Not Met (add Reason) [facility] Goal: Will remain free from infection Outcome: Not Met (add Reason) To be tx[facility] to [facility]  Problem: Skin Integrity: Goal: Risk for impaired skin integrity will decrease Outcome: Not Met (add Reason) Very ill, to be txr to [facility]   Problem: Activity: Goal: Risk for activity intolerance will decrease Outcome: Not Met (add Reason) Pt very ill, to be txr to [facility]  Problem: Fluid Volume: Goal: Ability to maintain a balanced intake and output will improve Outcome: Not Met (add Reason) To be txr to [facility]  Problem: Nutrition: Goal: Adequate nutrition will be maintained Outcome: Not Met (add Reason) To be txr to [facility]  Problem: Bowel/Gastric: Goal: Will not experience complications related to bowel motility Outcome: Not Met (add Reason) To be txr to [facility]  Comments: Pt very ill, being transferred to [facility] for continued care

## 2016-04-02 NOTE — ED Notes (Signed)
CBG checked at 0042 using the first digit on RIGHT hand resulted at 452 mg/dL. Out of concern for a possible inaccurate reading/test d/t multiple administrations of IV and SQ insulin, CBG performed again on using the third digit on the LEFT hand at 0047, which resulted 427 mg/dL.

## 2016-04-02 NOTE — Progress Notes (Addendum)
Inpatient Diabetes Program Recommendations  AACE/ADA: New Consensus Statement on Inpatient Glycemic Control (2015)  Target Ranges:  Prepandial:   less than 140 mg/dL      Peak postprandial:   less than 180 mg/dL (1-2 hours)      Critically ill patients:  140 - 180 mg/dL   Results for Traci Gibson, Traci Gibson (MRN [id number]) as of 04/02/2016 08:58  Ref. Range 04/01/2016 08:30 04/01/2016 19:31 04/01/2016 21:09 04/01/2016 22:13 04/01/2016 23:05 04/01/2016 23:38 04/02/2016 00:42 04/02/2016 00:47 04/02/2016 01:28 04/02/2016 02:57 04/02/2016 05:06 04/02/2016 07:29 04/02/2016 07:31  Glucose-Capillary Latest Ref Range: 65 - 99 mg/dL 202442 (H) >542>600 (HH) 706494 (H) 462 (H) 434 (H) 438 (H) 452 (H) 427 (H) 449 (H) 476 (H) 380 (H) 424 (H) 345 (H)    Admit with: Nausea and Vomiting/ Abdominal Pain/ Acute Thrombocytopenia  History: DM, Seizures  Home DM Meds: Metformin 1000 mg BID       Victoza??  Current Insulin Orders: Novolog Moderate Correction Scale/ SSI (0-15 units) TID AC + HS       -Note patient received 125 mg IV Solumedrol yesterday at 12pm.  Then started on Solumedrol 80 mg BID last PM.  -CBGs consistently >300-400 mg/dl range since steroids started.  -Patient has received a total of 66 units Novolog insulin since 8pm last night.      MD- Concern for BMET this AM- Anion Gap is 9 but CO2 down to 17.  Note other electrolytes on AM BMET are outside normal values.  Should patient be started on IVF with IV Insulin drip?  If decision made to start IV Insulin drip, patient will need to be transferred to ICU/Step-down as Med-Surg floor cannot have patients with IV Insulin drips.  Could we try IVF hydration along with a more aggressive insulin regimen?  Could try starting basal insulin: Lantus 8 units daily (0.15 units/kg dosing based on weight of 55kg) + Increase Novolog Moderate Correction Scale/ SSI (0-15 units) to Q4 hour coverage while NPO (currently ordered TID ac + hs)   Addendum 1220: Attempted to  speak with pt this AM.  Patient seemed confused and told me she could not remember what kind of medication she was supposed to be taking for her DM.  Stated to me she was diagnosed when she was 25 years old but could not remember where or by which doctor.  Spoke with RN caring for pt.  Per RN, [facility] for need of emergent Plasmapheresis.        --Will follow patient during hospitalization--  Ambrose FinlandJeannine Johnston Chelcie Estorga RN, MSN, CDE Diabetes Coordinator Inpatient Glycemic Control Team Team Pager: [phone] (8a-5p)

## 2016-04-02 NOTE — ED Notes (Signed)
Patient has had several parenteral insulin administrations. Patient has also had a significant drop in her Hgb level. Patient remains HYPERglycemic and is to receive a blood transfusion. Patient's bed assignment has been changed several times from general med-surg to step-down, back to general med-surg. [facility] to ensure that this patient was going to be placed appropriately on the assigned inpatient unit. AC aware that primary nurse has made admitting hospitalist aware of changes in condition, including the fluctuating CBGs and declining Hgb. Hospitalist with orders to proceed with patient admission to any med-surg unit as previously requested.

## 2016-04-02 NOTE — Progress Notes (Signed)
[facility]. VSS. Pt alert and oriented at present. Mother to follow in private vehicle

## 2016-04-02 NOTE — Progress Notes (Signed)
1025 Dr Luberta MutterKonidena in to see pt. Pt to be transferred to another hospital.  1030 - I called pt mother and updated her [id number] - mother updated again 1130 - Dr Luberta MutterKonidena spoke with pt's mother on the phone, updated pt condition and that she is to be [facility] this afternoon

## 2016-04-02 NOTE — Plan of Care (Signed)
Problem: Education: Goal: Knowledge of [facility] Education information/materials will improve Outcome: Progressing Pt likes to be called Joni ReiningNicole  Past Medical History:  Diagnosis Date  . Diabetes mellitus without complication (HCC)   . Seizures (HCC)     Pt has not been taking her home medication in a month" pt's mother stated

## 2016-04-02 NOTE — ED Notes (Signed)
Notified hospitalist that pt's CBG above 600 mg/dL Orders received for NS bolus, 10u SQ insulin, and close CBG monitoring. As the pt was already assigned to a bed on 1C, I explained to the Charge RN that d/t the pt's elevated CBG, the pt would not be going to the floor in a timely manner unlessl the pt's glucose condition was stabilized/corrected, and it would not be prudent to hold that assigned bed for someone that might eventually need to go to the ICU or stepdown for an insulin drip, especially since there were so many admissions being held in the ED d/t lack of bed availability already. Charge RN agreed, and the Licensed conveyancerunit secretary was instructed to notify bed tracking that the assigned bed for this pt was no longer needed, and should be made available for another admission.

## 2016-04-02 NOTE — Discharge Summary (Signed)
Traci Gibson, is a 25 y.o. female  DOB 1990-06-21  MRN [id number].  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 Diabetes mellitus without complication (HCC)   . Seizures (HCC)   . Thyroid disease     Past Surgical History:  Procedure Laterality Date  . ABDOMINAL SURGERY         History of present illness and  Hospital Course:     Kindly see H&P for history of present illness and admission details, please review complete Labs, Consult reports and Test reports for all details in brief  HPI  from the history and physical done on the day of admission 25 year old female patient with history of diabetes mellitus type 2, seizure disorder cause of abdominal pain, intractable nausea, vomiting for 2 days. Symptoms started after she ate some grapes. Noted to have rash in lower extremities for 2 days, everything started same time. Platelet count 11,000 emergency room, total bilirubin 6.1. Admitted to hospitalist service. LDH on admission 1700. Has, abdominal pain, nausea, vomiting, petechiae, generalized weakness. Patient did not  have any mental status changes.  Hospital Course   #1 . Acute thrombocytopenia with intractable nausea, vomiting, elevated LDH, petechial rash, acute renal failure: Patient initially admitted to hospitalist service, started on IV Solu-Medrol, received 1 unit of platelet transfusion, oncology is consulted. CT abdomen is done, CT abdomen shows perinephric stranding bilaterally indicating possible pyelonephritis. But patient has no flank pain. But patient continues to have severe hyponatremia with sodium 128, platelets did not improve after platelet transfusion, platelet initially was 11 on admission, today 16. Patient hemoglobin is 8, has renal failure with creatinine 1.93. LDH 1797. 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 I called [facility], patient is been accepted there but they don't have bed immediately , so I called to the [facility], I spoke with Dr. Brantley Stage who accepted the patient, patient will call to go to [facility] this afternoon. She needs urgent plasmapheresis, workup for TTP. Dr. Hyacinth Meeker advised [place] not to give any plasma  Products,platelet  Atransfusion unless patient has life-threatening bleeding. #2 acute renal failure, jaundice, anemia, schistocytes  On peripheral smear, pupuras indicating  TTP,  #3 diabetes mellitus type 2, elevated blood sugars, noncompliant with medications, as per mom patient started taking medications except metformin.  #4 seizure disorder: Patient supposed to be on Depakote but not taking.    Follow UP   Continue IV steroids, insulin sliding-scale coverage, IV fluids, pain medicine, nausea medicines but to avoid transfusion of platelets,  Plasma products   Discharge Instructions  and  Discharge Medications        Medication List    TAKE these medications    divalproex 500 MG DR tablet Commonly known  as:  DEPAKOTE Take 1,500 mg by mouth at bedtime.   levothyroxine 25 MCG tablet Commonly known as:  SYNTHROID, LEVOTHROID Take 25 mcg by mouth daily before breakfast.   lisinopril 10 MG tablet Commonly known as:  PRINIVIL,ZESTRIL Take 10 mg by mouth daily.   metFORMIN 1000 MG tablet Commonly known as:  GLUCOPHAGE Take 1,000 mg by mouth 2 (two) times daily.   VICTOZA Rentz Inject into the skin.         Diet and Activity recommendation: See Discharge Instructions above   Consults obtained -oncolgy(spoke over phone)   Major procedures and Radiology Reports - PLEASE review detailed and final reports for all details, in brief -     Ct Abdomen Pelvis Wo Contrast  Result Date: 04/01/2016 CLINICAL DATA:  Vomiting for 1 day. Concern for recent food poisoning EXAM: CT ABDOMEN AND PELVIS WITHOUT CONTRAST TECHNIQUE: Multidetector CT imaging of the abdomen and pelvis was performed following the standard protocol without IV contrast. Oral contrast was administered. COMPARISON:  October 27, 2007 FINDINGS: Lower chest: Lung bases are clear. Hepatobiliary: No focal liver lesions are evident on this noncontrast enhanced study. There is questionable sludge in the gallbladder. Gallbladder wall appears equivocally edematous. There is no appreciable biliary duct dilatation. Pancreas: No pancreatic mass or inflammatory focus. Spleen: No splenic lesions are evident. Adrenals/Urinary Tract: Adrenals appear normal bilaterally. Kidneys bilaterally show no evident mass or hydronephrosis on either side. There is perinephric stranding bilaterally. There is no renal or ureteral calculus. The urinary bladder wall is not thickened. There is focal air within the anterior aspect of the urinary bladder on the left. Stomach/Bowel: There is no appreciable bowel wall thickening. There is no bowel obstruction. No free air or portal venous air. : Is largely decompressed.  Vascular/Lymphatic: There is no abdominal aortic aneurysm. No vascular lesions are evident on this noncontrast enhanced study. There is no adenopathy in the abdomen or pelvis. Reproductive: Uterus is anteverted and canted toward the right. Intrauterine device is present within the endometrium. There is no pelvic mass or pelvic fluid collection. Other: The appendix appears normal. There is no ascites or abscess in the abdomen or pelvis. Musculoskeletal: There are no blastic or lytic bone lesions. There is no intramuscular or abdominal wall lesion. IMPRESSION: Small amount of air is noted in the urinary bladder. Question recent instrumentation. If patient has not had recent urinary bladder instrumentation, this air raises concern for infection with gas-forming organism. There is perinephric stranding bilaterally which potentially could indicate a degree of pyelonephritis bilaterally. No focal renal defect is identified on either side. No renal abscess. No hydronephrosis. No renal or ureteral calculus on either side. Question sludge and subtle gallbladder wall edema. Advise ultrasound of the gallbladder to further assess. No bowel obstruction. No abscess. No appreciable bowel wall thickening. Appendix appears normal. Intrauterine device positioned within the endometrium. Electronically Signed   By: Bretta BangWilliam  Woodruff III M.D.   On: 04/01/2016 14:51   Dg Chest Portable 1 View  Result Date: 04/01/2016 CLINICAL DATA:  Vomiting EXAM: PORTABLE CHEST 1 VIEW COMPARISON:  None. FINDINGS: No active infiltrate or effusion is seen. Mediastinal and hilar contours are unremarkable. The heart is within normal limits in size. IMPRESSION: No active disease. Electronically Signed   By: Dwyane DeePaul  Barry M.D.   On: 04/01/2016 12:00   [place] Abdomen Limited Ruq  Result Date: 04/01/2016 CLINICAL DATA:  Nausea and vomiting over the last day. EXAM: US ABDOMEN LIMITED - RIGHT UPPER QUADRANT COMPARISON:  CT 05/29/2007  FINDINGS: Gallbladder: No  gallstones or wall thickening visualized. No sonographic Murphy sign noted by sonographer. Common bile duct: Diameter: 3 mm, normal Liver: No focal lesion identified. Within normal limits in parenchymal echogenicity. IMPRESSION: Normal right upper quadrant ultrasound. No abnormality seen to explain the clinical presentation. Electronically Signed   By: Paulina FusiMark  Shogry M.D.   On: 04/01/2016 10:29    Micro Results    Recent Results (from the past 240 hour(s))  Blood culture (routine x 2)     Status: None (Preliminary result)   Collection Time: 04/01/16 12:05 PM  Result Value Ref Range Status   Specimen Description BLOOD  L AC  Final   Special Requests   Final    BOTTLES DRAWN AEROBIC AND ANAEROBIC  ANA 12ML AER 13ML   Culture NO GROWTH < 24 HOURS  Final   Report Status PENDING  Incomplete  Blood culture (routine x 2)     Status: None (Preliminary result)   Collection Time: 04/01/16 12:15 PM  Result Value Ref Range Status   Specimen Description BLOOD  RAC  Final   Special Requests   Final    BOTTLES DRAWN AEROBIC AND ANAEROBIC  ANA 13ML AER 11ML   Culture NO GROWTH < 24 HOURS  Final   Report Status PENDING  Incomplete       Today   Subjective:   Traci ShellerBrenda Zieger today critically ill with TTP and [facility] physician is Dr. Garret ReddishJ Miller .  Objective:   Blood pressure 106/80, pulse (!) 107, temperature 98.3 F (36.8 C), temperature source Oral, resp. rate 20, height 5\' 2"  (1.575 m), weight 55.9 kg (123 lb 3.2 oz), SpO2 100 %.   Intake/Output Summary (Last 24 hours) at 04/02/16 1033 Last data filed at 04/02/16 0649  Gross per 24 hour  Intake             1714 ml  Output                0 ml  Net             1714 ml    Exam Awake Alert, Oriented x 3, No new F.N deficits, Normal affect [place].AT,PERRAL Supple Neck,No JVD, No cervical lymphadenopathy appriciated.  Symmetrical Chest wall movement, Good air movement bilaterally, CTAB RRR,No  Gallops,Rubs or new Murmurs, No Parasternal Heave +ve B.Sounds, ABDOMEN TENDER., No organomegaly appriciated, No rebound -guarding or rigidity. No Cyanosis, Clubbing or edema, No new Rash or bruise  Data Review   CBC w Diff:  Lab Results  Component Value Date   WBC 10.7 04/02/2016   HGB 10.3 (L) 04/02/2016   HGB 13.2 02/27/2013   HCT 29.5 (L) 04/02/2016   HCT 39.6 02/27/2013   PLT 16 (LL) 04/02/2016   PLT 257 02/27/2013   LYMPHOPCT 13 04/01/2016   MONOPCT 10 04/01/2016   EOSPCT 0 04/01/2016   BASOPCT 0 04/01/2016    CMP:  Lab Results  Component Value Date   NA 128 (L) 04/02/2016   K 4.2 04/02/2016   CL 102 04/02/2016   CO2 17 (L) 04/02/2016   BUN 52 (H) 04/02/2016   CREATININE 1.55 (H) 04/02/2016   PROT 7.8 04/01/2016   ALBUMIN 4.4 04/01/2016   BILITOT 6.1 (H) 04/01/2016   ALKPHOS 52 04/01/2016   AST 64 (H) 04/01/2016   ALT 19 04/01/2016  .   Total Time in preparing paper work, data evaluation and todays exam - 35 minutes  Severus Brodzinski M.D  on 04/02/2016 at 10:33 AM    Note: This dictation was prepared with Dragon dictation along with smaller phrase technology. Any transcriptional errors that result from this process are unintentional.

## 2016-04-02 NOTE — ED Notes (Signed)
Dr Sheryle Hailiamond made aware of pt's 0300 CBG of 476 mg/dL. Dr Sheryle Hailiamond verbalized acknowledgment, and stated he will wait until the 0500 CBG check to determine which intervention will be the most appropriate at that time. Nettie ElmSylvia, 1C RN notified and voiced understanding that the 0500 CBG check will be reported to Dr Sheryle Hailiamond to determine an appropriate POC.

## 2016-04-03 LAB — TYPE AND SCREEN
ABO/RH(D): O POS
ANTIBODY SCREEN: NEGATIVE
UNIT DIVISION: 0

## 2016-04-03 LAB — URINE CULTURE: Culture: >=100000 — AB

## 2016-04-06 LAB — CULTURE, BLOOD (ROUTINE X 2)
CULTURE: NO GROWTH
Culture: NO GROWTH

## 2016-05-03 DEATH — deceased

## 2018-07-27 IMAGING — DX DG CHEST 1V PORT
1 series · 1 of 1 positions shown · non-contrast
Comparison: None.

CLINICAL DATA: Vomiting

EXAM:
PORTABLE CHEST 1 VIEW

[chest ap]
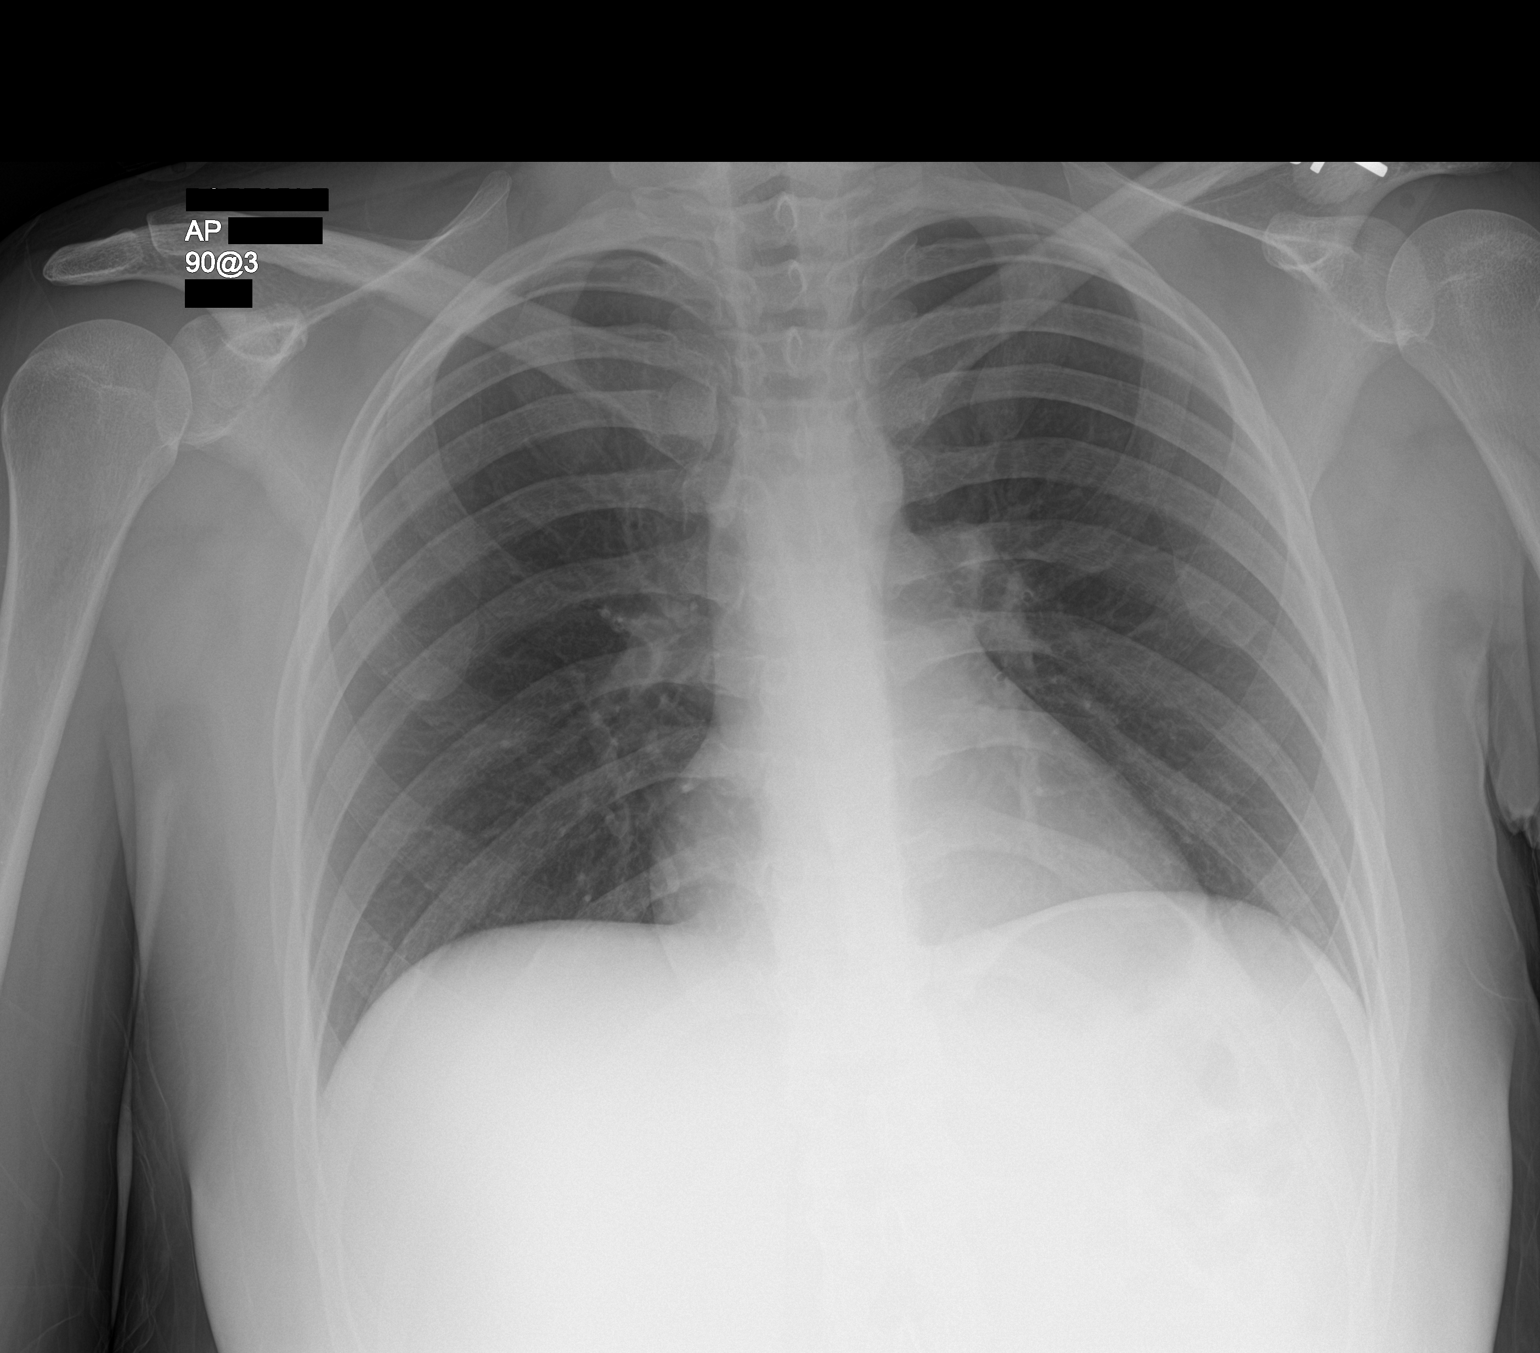

[1 of 1 positions shown; findings below may reference images not displayed]

FINDINGS: No active infiltrate or effusion is seen. Mediastinal and hilar
contours are unremarkable. The heart is within normal limits in
size.
IMPRESSION: No active disease.
